# Patient Record
Sex: Female | Born: 1965
Health system: Southern US, Community
[De-identification: ages and names within clinical notes are randomized; demographics above are authoritative.]

## PROBLEM LIST (undated history)

## (undated) DIAGNOSIS — R519 Headache, unspecified: Secondary | ICD-10-CM

## (undated) DIAGNOSIS — J3089 Other allergic rhinitis: Secondary | ICD-10-CM

## (undated) DIAGNOSIS — Z86718 Personal history of other venous thrombosis and embolism: Secondary | ICD-10-CM

## (undated) DIAGNOSIS — R51 Headache: Secondary | ICD-10-CM

## (undated) DIAGNOSIS — T148XXA Other injury of unspecified body region, initial encounter: Secondary | ICD-10-CM

## (undated) DIAGNOSIS — G5603 Carpal tunnel syndrome, bilateral upper limbs: Principal | ICD-10-CM

## (undated) HISTORY — DX: Headache: R51

## (undated) HISTORY — DX: Other allergic rhinitis: J30.89

## (undated) HISTORY — DX: Headache, unspecified: R51.9

## (undated) HISTORY — DX: Carpal tunnel syndrome, bilateral upper limbs: G56.03

## (undated) HISTORY — DX: Other injury of unspecified body region, initial encounter: T14.8XXA

## (undated) HISTORY — PX: TOOTH EXTRACTION: SUR596

## (undated) HISTORY — DX: Personal history of other venous thrombosis and embolism: Z86.718

---

## 1999-08-21 ENCOUNTER — Other Ambulatory Visit: Admission: RE | Admit: 1999-08-21 | Discharge: 1999-08-21 | Payer: Self-pay

## 2000-11-29 ENCOUNTER — Other Ambulatory Visit: Admission: RE | Admit: 2000-11-29 | Discharge: 2000-11-29 | Payer: Self-pay | Admitting: Obstetrics and Gynecology

## 2002-04-21 ENCOUNTER — Other Ambulatory Visit: Admission: RE | Admit: 2002-04-21 | Discharge: 2002-04-21 | Payer: Self-pay | Admitting: Obstetrics and Gynecology

## 2003-04-30 ENCOUNTER — Other Ambulatory Visit: Admission: RE | Admit: 2003-04-30 | Discharge: 2003-04-30 | Payer: Self-pay | Admitting: Obstetrics and Gynecology

## 2004-05-05 ENCOUNTER — Other Ambulatory Visit: Admission: RE | Admit: 2004-05-05 | Discharge: 2004-05-05 | Payer: Self-pay | Admitting: *Deleted

## 2004-09-16 ENCOUNTER — Encounter: Admission: RE | Admit: 2004-09-16 | Discharge: 2004-09-16 | Payer: Self-pay | Admitting: *Deleted

## 2004-09-16 ENCOUNTER — Inpatient Hospital Stay (HOSPITAL_COMMUNITY): Admission: EM | Admit: 2004-09-16 | Discharge: 2004-09-17 | Payer: Self-pay | Admitting: *Deleted

## 2004-11-27 ENCOUNTER — Encounter: Admission: RE | Admit: 2004-11-27 | Discharge: 2004-11-27 | Payer: Self-pay | Admitting: Obstetrics and Gynecology

## 2005-05-13 ENCOUNTER — Other Ambulatory Visit: Admission: RE | Admit: 2005-05-13 | Discharge: 2005-05-13 | Payer: Self-pay | Admitting: *Deleted

## 2006-06-10 LAB — HM PAP SMEAR

## 2006-07-09 ENCOUNTER — Other Ambulatory Visit: Admission: RE | Admit: 2006-07-09 | Discharge: 2006-07-09 | Payer: Self-pay | Admitting: Obstetrics and Gynecology

## 2007-06-14 ENCOUNTER — Ambulatory Visit: Payer: Self-pay | Admitting: Internal Medicine

## 2007-06-14 DIAGNOSIS — N943 Premenstrual tension syndrome: Secondary | ICD-10-CM | POA: Insufficient documentation

## 2007-06-14 DIAGNOSIS — I82409 Acute embolism and thrombosis of unspecified deep veins of unspecified lower extremity: Secondary | ICD-10-CM | POA: Insufficient documentation

## 2007-06-14 DIAGNOSIS — Z86718 Personal history of other venous thrombosis and embolism: Secondary | ICD-10-CM | POA: Insufficient documentation

## 2007-06-17 ENCOUNTER — Ambulatory Visit: Payer: Self-pay | Admitting: Family Medicine

## 2007-06-17 ENCOUNTER — Encounter: Payer: Self-pay | Admitting: Internal Medicine

## 2007-07-13 ENCOUNTER — Ambulatory Visit: Payer: Self-pay | Admitting: Internal Medicine

## 2007-07-14 ENCOUNTER — Other Ambulatory Visit: Admission: RE | Admit: 2007-07-14 | Discharge: 2007-07-14 | Payer: Self-pay | Admitting: Obstetrics and Gynecology

## 2007-07-14 ENCOUNTER — Encounter: Payer: Self-pay | Admitting: Internal Medicine

## 2007-07-19 ENCOUNTER — Ambulatory Visit: Payer: Self-pay | Admitting: Internal Medicine

## 2007-07-19 DIAGNOSIS — E559 Vitamin D deficiency, unspecified: Secondary | ICD-10-CM | POA: Insufficient documentation

## 2007-07-19 DIAGNOSIS — M899 Disorder of bone, unspecified: Secondary | ICD-10-CM | POA: Insufficient documentation

## 2007-07-19 DIAGNOSIS — R82998 Other abnormal findings in urine: Secondary | ICD-10-CM | POA: Insufficient documentation

## 2007-07-19 DIAGNOSIS — M949 Disorder of cartilage, unspecified: Secondary | ICD-10-CM

## 2007-07-19 LAB — CONVERTED CEMR LAB
Blood in Urine, dipstick: NEGATIVE
Glucose, Urine, Semiquant: NEGATIVE
Ketones, urine, test strip: NEGATIVE
Urobilinogen, UA: 0.2
pH: 5.5

## 2007-08-05 ENCOUNTER — Encounter: Payer: Self-pay | Admitting: Internal Medicine

## 2007-10-13 ENCOUNTER — Ambulatory Visit: Payer: Self-pay | Admitting: Internal Medicine

## 2007-10-17 LAB — CONVERTED CEMR LAB: Vit D, 1,25-Dihydroxy: 42 (ref 30–89)

## 2008-09-28 ENCOUNTER — Other Ambulatory Visit: Admission: RE | Admit: 2008-09-28 | Discharge: 2008-09-28 | Payer: Self-pay | Admitting: Obstetrics & Gynecology

## 2008-10-22 ENCOUNTER — Encounter: Admission: RE | Admit: 2008-10-22 | Discharge: 2008-10-22 | Payer: Self-pay | Admitting: Obstetrics and Gynecology

## 2008-11-20 ENCOUNTER — Telehealth (INDEPENDENT_AMBULATORY_CARE_PROVIDER_SITE_OTHER): Payer: Self-pay | Admitting: *Deleted

## 2008-11-22 ENCOUNTER — Ambulatory Visit: Payer: Self-pay | Admitting: Internal Medicine

## 2008-11-22 LAB — CONVERTED CEMR LAB
Albumin: 4.1 g/dL (ref 3.5–5.2)
Basophils Relative: 5.2 % — ABNORMAL HIGH (ref 0.0–3.0)
Blood in Urine, dipstick: NEGATIVE
CO2: 27 meq/L (ref 19–32)
Chloride: 102 meq/L (ref 96–112)
Creatinine, Ser: 0.6 mg/dL (ref 0.4–1.2)
Eosinophils Absolute: 0.2 10*3/uL (ref 0.0–0.7)
Glucose, Bld: 78 mg/dL (ref 70–99)
Glucose, Urine, Semiquant: NEGATIVE
Hemoglobin: 13.8 g/dL (ref 12.0–15.0)
Ketones, urine, test strip: NEGATIVE
MCHC: 33.8 g/dL (ref 30.0–36.0)
MCV: 91 fL (ref 78.0–100.0)
Monocytes Absolute: 0.4 10*3/uL (ref 0.1–1.0)
Neutro Abs: 3.8 10*3/uL (ref 1.4–7.7)
Nitrite: NEGATIVE
RBC: 4.48 M/uL (ref 3.87–5.11)
RDW: 12.8 % (ref 11.5–14.6)
Sodium: 138 meq/L (ref 135–145)
Specific Gravity, Urine: 1.025
TSH: 1.95 microintl units/mL (ref 0.35–5.50)
Total CHOL/HDL Ratio: 4
Total Protein: 6.9 g/dL (ref 6.0–8.3)
Triglycerides: 113 mg/dL (ref 0.0–149.0)
Vit D, 25-Hydroxy: 33 ng/mL (ref 30–89)
pH: 5.5

## 2008-11-27 ENCOUNTER — Ambulatory Visit: Payer: Self-pay | Admitting: Internal Medicine

## 2008-11-27 DIAGNOSIS — J309 Allergic rhinitis, unspecified: Secondary | ICD-10-CM | POA: Insufficient documentation

## 2008-12-04 ENCOUNTER — Encounter: Payer: Self-pay | Admitting: Internal Medicine

## 2009-05-30 ENCOUNTER — Ambulatory Visit: Payer: Self-pay | Admitting: Internal Medicine

## 2009-06-04 LAB — CONVERTED CEMR LAB: Vit D, 25-Hydroxy: 44 ng/mL (ref 30–89)

## 2009-08-22 ENCOUNTER — Ambulatory Visit: Payer: Self-pay | Admitting: Internal Medicine

## 2009-08-22 ENCOUNTER — Encounter: Payer: Self-pay | Admitting: Internal Medicine

## 2009-09-12 ENCOUNTER — Telehealth: Payer: Self-pay | Admitting: Internal Medicine

## 2009-11-08 LAB — HM MAMMOGRAPHY: HM Mammogram: NORMAL

## 2009-11-14 ENCOUNTER — Encounter: Admission: RE | Admit: 2009-11-14 | Discharge: 2009-11-14 | Payer: Self-pay | Admitting: Obstetrics and Gynecology

## 2009-12-12 ENCOUNTER — Ambulatory Visit: Payer: Self-pay | Admitting: Internal Medicine

## 2009-12-12 LAB — CONVERTED CEMR LAB
ALT: 16 units/L (ref 0–35)
AST: 19 units/L (ref 0–37)
Albumin: 4.4 g/dL (ref 3.5–5.2)
Alkaline Phosphatase: 42 units/L (ref 39–117)
BUN: 13 mg/dL (ref 6–23)
Basophils Relative: 0.5 % (ref 0.0–3.0)
CO2: 28 meq/L (ref 19–32)
Chloride: 103 meq/L (ref 96–112)
Eosinophils Absolute: 0.2 10*3/uL (ref 0.0–0.7)
HDL: 51 mg/dL (ref >39.00)
Hemoglobin: 14.1 g/dL (ref 12.0–15.0)
Lymphocytes Relative: 22.8 % (ref 12.0–46.0)
MCHC: 34.6 g/dL (ref 30.0–36.0)
Monocytes Relative: 8.1 % (ref 3.0–12.0)
Neutro Abs: 4.1 10*3/uL (ref 1.4–7.7)
Nitrite: NEGATIVE
RBC: 4.47 M/uL (ref 3.87–5.11)
Sodium: 140 meq/L (ref 135–145)
Specific Gravity, Urine: 1.01
Total CHOL/HDL Ratio: 3
Total Protein: 7.3 g/dL (ref 6.0–8.3)
WBC Urine, dipstick: NEGATIVE

## 2009-12-20 ENCOUNTER — Ambulatory Visit: Payer: Self-pay | Admitting: Internal Medicine

## 2009-12-20 DIAGNOSIS — F418 Other specified anxiety disorders: Secondary | ICD-10-CM | POA: Insufficient documentation

## 2010-06-20 ENCOUNTER — Ambulatory Visit: Payer: Self-pay | Admitting: Internal Medicine

## 2010-06-26 ENCOUNTER — Ambulatory Visit: Payer: Self-pay | Admitting: Internal Medicine

## 2010-06-26 DIAGNOSIS — Z87891 Personal history of nicotine dependence: Secondary | ICD-10-CM | POA: Insufficient documentation

## 2010-07-08 ENCOUNTER — Telehealth: Payer: Self-pay | Admitting: *Deleted

## 2010-08-30 ENCOUNTER — Encounter: Payer: Self-pay | Admitting: Obstetrics and Gynecology

## 2010-09-07 LAB — CONVERTED CEMR LAB
ALT: 14 units/L (ref 0–35)
Alkaline Phosphatase: 45 units/L (ref 39–117)
BUN: 7 mg/dL (ref 6–23)
Basophils Relative: 0.3 % (ref 0.0–1.0)
CO2: 26 meq/L (ref 19–32)
Calcium: 9.4 mg/dL (ref 8.4–10.5)
Creatinine, Ser: 0.8 mg/dL (ref 0.4–1.2)
HDL: 50.6 mg/dL (ref >39.0)
Hemoglobin: 14.2 g/dL (ref 12.0–15.0)
Ketones, urine, test strip: NEGATIVE
LDL Cholesterol: 116 mg/dL — ABNORMAL HIGH (ref 0–99)
Monocytes Relative: 7.8 % (ref 3.0–11.0)
Nitrite: NEGATIVE
Platelets: 381 10*3/uL (ref 150–400)
RDW: 12.6 % (ref 11.5–14.6)
Total Bilirubin: 0.7 mg/dL (ref 0.3–1.2)
Total Protein: 6.7 g/dL (ref 6.0–8.3)
Triglycerides: 72 mg/dL (ref 0–149)
Urobilinogen, UA: 0.2
VLDL: 14 mg/dL (ref 0–40)
WBC Urine, dipstick: NEGATIVE

## 2010-09-09 NOTE — Assessment & Plan Note (Signed)
Summary: 6 month rov/njr   Vital Signs:  Patient profile:   45 year old female Menstrual status:  regular LMP:     06/12/2010 Weight:      168 pounds Pulse rate:   80 / minute BP sitting:   110 / 70  (left arm) Cuff size:   regular  Vitals Entered By: Romualdo Bolk, CMA Duncan Dull) (June 26, 2010 9:36 AM)  Contraindications/Deferment of Procedures/Staging:    Test/Procedure: FLU VAX    Reason for deferment: patient declined  CC: Follow-up visit on vit d level, Abdominal Pain LMP (date): 06/12/2010 LMP - Character: normal Menarche (age onset years): 14   Menses interval (days): 28 Menstrual flow (days): 5 Enter LMP: 06/12/2010 Last PAP Result normal   History of Present Illness: Kendra Fox comes in today  for follow up of vit d . and med check    Xanax  ocassional use  stil has one refill.   Skin  : differin.    helping acne  and expired rx.   has tried  other meds in the past per Dermatology   and not rosacea in past . NO sig help with oral antibiotic .    Topical helps  the   Vitamin  D   taking  every other or  q 3 weeks.   no   se .    Preventive Screening-Counseling & Management  Alcohol-Tobacco     Alcohol drinks/day: <1     Alcohol type: beer     Smoking Status: quit > 6 months     Packs/Day: 1/2     Year Quit: 2009  Caffeine-Diet-Exercise     Caffeine use/day: less than 1 a day     Does Patient Exercise: no  Current Medications (verified): 1)  Ibuprofen 800 Mg  Tabs (Ibuprofen) .Marland Kitchen.. 1 By Mouth Three Times A Day As Needed 2)  Alprazolam 0.25 Mg  Tabs (Alprazolam) .... 1/2 To 1 Once Daily As Needed 3)  Differin 0.1 %  Crea (Adapalene) .... Apply To Face At Bedtime 4)  Drisdol 16109 Unit  Caps (Ergocalciferol) .Marland Kitchen.. 1 By Mouth Every Other Week or As Directed 5)  Allegra 180 Mg Tabs (Fexofenadine Hcl) 6)  V[employer]amin Shoppe Women's Daily Multi Vitamin With Iron  Allergies (verified): No Known Drug Allergies  Past History:  Past medical,  surgical, family and social histories (including risk factors) reviewed for relevance to current acute and chronic problems.  Past Medical History: Reviewed history from 11/27/2008 and no changes required. DVT, hx of fracture l ankle x 2 Allergic rhinitis  Past Surgical History: Reviewed history from 07/19/2007 and no changes required. Denies surgical history  Past History:  Care Management: Gynecology: Ria Comment, NP Orthopedics: [place]  Dermatology: Karlyn Agee   Family History: Reviewed history from 12/20/2009 and no changes required. Family History of Respiratory disease-Father COPD-smoker Migraine- mother no osteoporosis thyroid kidney stones  Father / early parkinsons   Social History: Reviewed history from 12/20/2009 and no changes required. 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 Married 2 cats Current Smoker 20 years  stopped last year .    husband stopped  Alcohol use-yes Drug use-no Regular exercise-no vegan  see data base  soy milk   Physical Exam  General:  Well-developed,well-nourished,in no acute distress; alert,appropriate and cooperative throughout examination Head:  normocephalic and atraumatic.   Neck:  No deformities, masses, or tenderness noted. Lungs:  Normal respiratory effort, chest expands symmetrically. Lungs are clear to auscultation, no  crackles or wheezes. Heart:  Normal rate and regular rhythm. S1 and S2 normal without gallop, murmur, click, rub or other extra sounds. Pulses:  pulses intact without delay   Neurologic:  non focal  Skin:  face  with  mid erythema  no paules today  eyes clear  Cervical Nodes:  No lymphadenopathy noted Psych:  Oriented X3, good eye contact, not anxious appearing, and not depressed appearing.     Impression & Recommendations:  Problem # 1:  ACNE VULGARIS, ADULT (ICD-706.1) helpful   orals not helpful in the past Her updated medication list for this problem includes:    Differin 0.1  % Crea (Adapalene) .Marland Kitchen... Apply to face at bedtime  Problem # 2:  VITAMIN D DEFICIENCY (ICD-268.9) ok on every 2-3 weeks high dose . would like to decrease to otc  and repeat at her check up   original  was in the teens and now   oin 30 range .     Problem # 3:  ANXIETY, SITUATIONAL (ICD-308.3) stable using as needed meds   . call with refills   Problem # 4:  TOBACCO USE, QUIT (ICD-V15.82) continue  tobacco free   Problem # 5:  OSTEOPENIA (ICD-733.90)  Her updated medication list for this problem includes:    Drisdol 30160 Unit Caps (Ergocalciferol) .Marland Kitchen... 1 by mouth every other week or as directed  Complete Medication List: 1)  Ibuprofen 800 Mg Tabs (Ibuprofen) .Marland Kitchen.. 1 by mouth three times a day as needed 2)  Alprazolam 0.25 Mg Tabs (Alprazolam) .... 1/2 to 1 once daily as needed 3)  Differin 0.1 % Crea (Adapalene) .... Apply to face at bedtime 4)  Drisdol 10932 Unit Caps (Ergocalciferol) .Marland Kitchen.. 1 by mouth every other week or as directed 5)  Allegra 180 Mg Tabs (Fexofenadine hcl) 6)  [employer] Women's Daily Multi Vitamin With Iron    Patient Instructions: 1)  switch over to 1000- 2000 international units per day  of vitamin D 2)  call for refills . 3)  check up cpx with labs and vitamin d level .   in  May 2012 .  4)  .     Orders Added: 1)  Est. Patient Level III [35573]

## 2010-09-09 NOTE — Progress Notes (Signed)
Summary: REQ FOR VIT D CK  Phone Note Call from Patient   Caller: Patient [phone] Reason for Call: Talk to Doctor Summary of Call: Pt would like to have Vit D ck included in CPX labs she has done in May 2011?  Can you adv order for same (vit D ck)?  Initial call taken by: Debbra Riding,  September 12, 2009 9:31 AM  Follow-up for Phone Call        Phone Call Completed-----Order for vit d to be done at time of cpx labs viewed in EMR... same added to lab appt in May. Follow-up by: Debbra Riding,  September 12, 2009 9:44 AM

## 2010-09-09 NOTE — Progress Notes (Signed)
Summary: refill on xanax  Phone Note From Pharmacy   Caller: [facility] # [id number]* Reason for Call: Needs renewal Details for Reason: xanax Summary of Call: last filled on 05/10/10 #30 Initial call taken by: Romualdo Bolk, CMA (AAMA),  July 08, 2010 11:49 AM  Follow-up for Phone Call        ok x 3  Follow-up by: Madelin Headings MD,  July 08, 2010 12:43 PM  Additional Follow-up for Phone Call Additional follow up Details #1::        faxed back to pharmacy Additional Follow-up by: Romualdo Bolk, CMA Duncan Dull),  July 08, 2010 1:06 PM    Prescriptions: ALPRAZOLAM 0.25 MG  TABS (ALPRAZOLAM) 1/2 to 1 once daily as needed  #30 x 2   Entered by:   Romualdo Bolk, CMA (AAMA)   Authorized by:   Madelin Headings MD   Signed by:   Romualdo Bolk, CMA (AAMA) on 07/08/2010   Method used:   Handwritten   RxID:   [id number]

## 2010-09-09 NOTE — Assessment & Plan Note (Signed)
Summary: CPX // RS   Vital Signs:  Patient profile:   45 year old female Menstrual status:  regular LMP:     12/13/2009 Height:      64.75 inches Weight:      162 pounds BMI:     27.26 Pulse rate:   80 / minute BP sitting:   100 / 60  (left arm) Cuff size:   regular  Vitals Entered By: Romualdo Bolk, CMA (AAMA) (Dec 20, 2009 8:45 AM) CC: CPX no pap- Pt has a gyn who does paps LMP (date): 12/13/2009 LMP - Character: normal Menarche (age onset years): 14   Menses interval (days): 28 Menstrual flow (days): 5 Enter LMP: 12/13/2009 Last PAP Result normal   History of Present Illness: Kendra Fox  comes in comes in today   for preventive visit.  Since last visit  here  there have been no major changes in health status  .  GYne started medication  x anax  for situation anxiety   and  asked for [place] to prescribe.     Vit d Was put back on rx vit d when level was low.  No new fractures.      Preventive Care Screening  Mammogram:    Date:  11/08/2009    Results:  normal   Pap Smear:    Date:  10/08/2009    Results:  normal   Last Tetanus Booster:    Date:  08/10/2004    Results:  Historical    Preventive Screening-Counseling & Management  Alcohol-Tobacco     Alcohol drinks/day: <1     Alcohol type: beer     Smoking Status: quit > 6 months     Packs/Day: 1/2     Year Quit: 2009  Caffeine-Diet-Exercise     Caffeine use/day: less than 1 a day     Does Patient Exercise: no  Hep-HIV-STD-Contraception     Dental Visit-last 6 months yes     Sun Exposure-Excessive: no  Safety-Violence-Falls     Seat Belt Use: 100     Firearms in the Home: no firearms in the home     Smoke Detectors: yes      Blood Transfusions:  no.    Current Medications (verified): 1)  Ibuprofen 800 Mg  Tabs (Ibuprofen) .Marland Kitchen.. 1 By Mouth Three Times A Day As Needed 2)  Alprazolam 0.25 Mg  Tabs (Alprazolam) .... 1/2 To 1 Once Daily As Needed 3)  Differin 0.1 %  Crea (Adapalene) ....  Apply To Face At Bedtime 4)  Drisdol 32440 Unit  Caps (Ergocalciferol) .Marland Kitchen.. 1 By Mouth Q Week As Directed 5)  Allegra 180 Mg Tabs (Fexofenadine Hcl) 6)  Vitamin Shop[employer] Women's Daily Multi Vitamin With Iron  Allergies (verified): No Known Drug Allergies  Past History:  Past medical, surgical, family and social histories (including risk factors) reviewed, and no changes noted (except as noted below).  Past Medical History: Reviewed history from 11/27/2008 and no changes required. DVT, hx of fracture l ankle x 2 Allergic rhinitis  Past Surgical History: Reviewed history from 07/19/2007 and no changes required. Denies surgical history  Past History:  Care Management: Gynecology: Ria Comment, NP Orthopedics: [place]  Dermatology: Karlyn Agee   Family History: Reviewed history from 11/27/2008 and no changes required. Family History of Respiratory disease-Father COPD-smoker Migraine- mother no osteoporosis thyroid kidney stones  Father / early parkinsons   Social History: Reviewed history from 11/27/2008 and no changes required. 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    husband stopped  Alcohol use-yes Drug use-no Regular exercise-no vegan  see data base  soy milk  Caffeine use/day:  less than 1 a day Dental Care w/in 6 mos.:  yes Sun Exposure-Excessive:  no Blood Transfusions:  no  Review of Systems  The patient denies anorexia, fever, weight loss, weight gain, vision loss, decreased hearing, hoarseness, chest pain, syncope, dyspnea on exertion, peripheral edema, prolonged cough, headaches, hemoptysis, abdominal pain, melena, hematochezia, severe indigestion/heartburn, hematuria, incontinence, genital sores, muscle weakness, suspicious skin lesions, transient blindness, difficulty walking, depression, unusual weight change, abnormal bleeding, enlarged lymph nodes, angioedema, and breast  masses.   Physical Exam General Appearance: well developed, well nourished, no acute distress Eyes: conjunctiva and lids normal, PERRLA, EOMI, fundi WNL Ears, Nose, Mouth, Throat: TM clear, nares clear, oral exam WNL Neck: supple, no lymphadenopathy, no thyromegaly, no JVD Respiratory: clear to auscultation and percussion, respiratory effort normal Cardiovascular: regular rate and rhythm, S1-S2, no murmur, rub or gallop, no bruits, peripheral pulses normal and symmetric, no cyanosis, clubbing, edema or varicosities Chest: no scars, masses, tenderness; no asymmetry, skin changes, nipple discharge   Gastrointestinal: soft, non-tender; no hepatosplenomegaly, masses; active bowel sounds all quadrants,  Genitourinary: per gyne Lymphatic: no cervical, axillary or inguinal adenopathy Musculoskeletal: gait normal, muscle tone and strength WNL, no joint swelling, effusions, discoloration, crepitus  Skin: , good turgor, color WNL, no rashes, lesions, or ulcerations faded acne no active lesions today  Neurologic: normal mental status, normal reflexes, normal strength, sensation, and motion Psychiatric: alert; oriented to person, place and time Other Exam:  see labs  and DEXA     Impression & Recommendations:  Problem # 1:  PREVENTIVE HEALTH CARE (ICD-V70.0) Discussed nutrition,exercise,diet,healthy weight, vitamin D and calcium.   Problem # 2:  OSTEOPENIA (ICD-733.90) reviewed dexa      Her updated medication list for this problem includes:    Drisdol 30865 Unit Caps (Ergocalciferol) .Marland Kitchen... 1 by mouth every other week or as directed  Problem # 3:  ACNE VULGARIS, ADULT (ICD-706.1) Assessment: Unchanged disc rx  will continue  Her updated medication list for this problem includes:    Differin 0.1 % Crea (Adapalene) .Marland Kitchen... Apply to face at bedtime  Problem # 4:  VITAMIN D DEFICIENCY (ICD-268.9) better   but had to go back on  high dose vit d    will decrease and follow.   Problem # 5:   ANXIETY, SITUATIONAL (ICD-308.3) Assessment: New Discussed risk benefit   of medication and  use.    ok to  rx med and follow   Complete Medication List: 1)  Ibuprofen 800 Mg Tabs (Ibuprofen) .Marland Kitchen.. 1 by mouth three times a day as needed 2)  Alprazolam 0.25 Mg Tabs (Alprazolam) .... 1/2 to 1 once daily as needed 3)  Differin 0.1 % Crea (Adapalene) .... Apply to face at bedtime 4)  Drisdol 78469 Unit Caps (Ergocalciferol) .Marland Kitchen.. 1 by mouth every other week or as directed 5)  Allegra 180 Mg Tabs (Fexofenadine hcl) 6)  Vitamin Shoppe Women's Daily Multi Vitamin With Iron   Patient Instructions: 1)  call for refills   2)  continue weight bearing  exercise .  3)  Decrease the vit d to every other week   for now  4)  Vit d level in 6 months and then ROV . Prescriptions: ALPRAZOLAM 0.25 MG  TABS (ALPRAZOLAM) 1/2 to 1  once daily as needed  #30 x 2   Entered and Authorized by:   Madelin Headings MD   Signed by:   Madelin Headings MD on 12/20/2009   Method used:   Print then Give to Patient   RxID:   [id number]

## 2010-09-09 NOTE — Miscellaneous (Signed)
Summary: BONE DENSITY  Clinical Lists Changes  Orders: Added new Test order of T-Bone Densitometry (77080) - Signed Added new Test order of T-Lumbar Vertebral Assessment (77082) - Signed 

## 2010-11-04 ENCOUNTER — Other Ambulatory Visit: Payer: Self-pay | Admitting: Internal Medicine

## 2010-11-04 DIAGNOSIS — Z1231 Encounter for screening mammogram for malignant neoplasm of breast: Secondary | ICD-10-CM

## 2010-11-18 ENCOUNTER — Ambulatory Visit
Admission: RE | Admit: 2010-11-18 | Discharge: 2010-11-18 | Disposition: A | Discharge status: Home / Self Care | Payer: 59 | Source: Ambulatory Visit | Attending: Internal Medicine | Admitting: Internal Medicine

## 2010-11-18 DIAGNOSIS — Z1231 Encounter for screening mammogram for malignant neoplasm of breast: Secondary | ICD-10-CM

## 2010-12-19 ENCOUNTER — Other Ambulatory Visit (INDEPENDENT_AMBULATORY_CARE_PROVIDER_SITE_OTHER): Payer: 59 | Admitting: Internal Medicine

## 2010-12-19 DIAGNOSIS — Z0000 Encounter for general adult medical examination without abnormal findings: Secondary | ICD-10-CM

## 2010-12-19 LAB — HEPATIC FUNCTION PANEL
ALT: 17 U/L (ref 0–35)
Albumin: 4.3 g/dL (ref 3.5–5.2)
Alkaline Phosphatase: 39 U/L (ref 39–117)
Total Protein: 7 g/dL (ref 6.0–8.3)

## 2010-12-19 LAB — TSH: TSH: 2.53 u[IU]/mL (ref 0.35–5.50)

## 2010-12-19 LAB — POCT URINALYSIS DIPSTICK
Glucose, UA: NEGATIVE
Leukocytes, UA: NEGATIVE
pH, UA: 5.5

## 2010-12-19 LAB — CBC WITH DIFFERENTIAL/PLATELET
Basophils Relative: 0.7 % (ref 0.0–3.0)
Eosinophils Absolute: 0.2 10*3/uL (ref 0.0–0.7)
Hemoglobin: 14.3 g/dL (ref 12.0–15.0)
Lymphocytes Relative: 22.7 % (ref 12.0–46.0)
MCHC: 34 g/dL (ref 30.0–36.0)
MCV: 93.1 fl (ref 78.0–100.0)
Neutro Abs: 4.2 10*3/uL (ref 1.4–7.7)
RBC: 4.52 Mil/uL (ref 3.87–5.11)

## 2010-12-19 LAB — BASIC METABOLIC PANEL
CO2: 28 mEq/L (ref 19–32)
Calcium: 9.2 mg/dL (ref 8.4–10.5)
Chloride: 105 mEq/L (ref 96–112)
Sodium: 141 mEq/L (ref 135–145)

## 2010-12-19 LAB — LIPID PANEL: HDL: 49.1 mg/dL (ref >39.00)

## 2010-12-20 LAB — VITAMIN D 25 HYDROXY (VIT D DEFICIENCY, FRACTURES): Vit D, 25-Hydroxy: 33 ng/mL (ref 30–89)

## 2010-12-26 NOTE — Discharge Summary (Signed)
Kendra Fox, MARKERT             ACCOUNT NO.:  [id number]   MEDICAL RECORD NO.:  [id number]          PATIENT TYPE:  INP   LOCATION:  0375                         FACILITY:  [facility]   PHYSICIAN:  Lonia Blood, M.D.      DATE OF BIRTH:  [birth year]   DATE OF ADMISSION:  09/16/2004  DATE OF DISCHARGE:  09/17/2004                                 DISCHARGE SUMMARY   PRIMARY CARE PHYSICIAN:  Soyla Murphy. Renne Crigler, M.D.   DISCHARGE DIAGNOSES:  1.  Left lower extremity deep venous thrombosis.  2.  Left ankle fracture.  3.  Transient hypokalemia.  4.  Transient hyponatremia.   DISCHARGE MEDICATIONS:  1.  Coumadin start 10 mg today, then 5 mg daily, and further adjustment by      Dr. Carolee Rota office.  2.  Lovenox injection 75 mg q.12h. subcutaneously until INR is between 2 and      3.  3.  Darvocet-N 100 1-2 tablets q.4-6h. p.r.n. for pain.   DISPOSITION:  The patient is being discharged with home health care.  [facility] nurse will be over starting from tonight to help  the patient with her Lovenox injection as well as train the patient and her  husband on how to give the patient Lovenox injection until INR is 2-3.  The  patient will also have daily INR checks through the home health agency  starting from tomorrow and the results will be called in to Dr. Carolee Rota  office for further adjustment of her Coumadin dose.   PROCEDURES PERFORMED:  Left lower extremity Doppler ultrasound that shows  deep venous thrombosis of the left calf.  This was on September 16, 2004.   CONSULTATIONS:  None, although social visit done by orthopedics surgery.   BRIEF HISTORY AND PHYSICAL:  She is a 45 year old white female with no  significant past medical history, who broke her ankle about three weeks ago.  The patient has hard cast in place and has been doing well including onset  of new exercise on Thursday and Friday.  The patient started having cramps  in her left leg, which necessitated her  coming to the hospital.  She went  back to her orthopedic surgeon for further follow-up and was told to go over  to diagnostic radiology for further workup.  That workup showed that she had  left lower extremity DVT, hence, she was sent to the hospital.  The patient  had no chest pain or any symptoms of possible PE.  Her vitals were also very  stable in the emergency room.   LABORATORY DATA:  Her labs mainly showed transient hyponatremia with sodium  132 and potassium 3.4.  Otherwise, everything else was stable.   She was subsequently admitted for management of left lower extremity DVT  with no evidence of PE.   HOSPITAL COURSE:  1.  LEFT LOWER EXTREMITY DVT:  This seems to have been triggered by the left      ankle fracture and subsequent immobility.  The patient was initiated on      heparin and concomitant Coumadin therapy the next  day.  We discussed      with the patient at length.  She wants to go home sooner.  With that in      mind this arrangement was made where home health will come out to the      patient's house and assist her with subcutaneous Lovenox injection.  The      patient will be fully trained and once she is comfortable doing it, she      will continue to give herself shots or her husband will do that for her      until her INR is between 2 and 3.  Subsequent follow-up of her Coumadin      will be done by Dr. Merri Brunette, her primary care physician, in their      office.   1.  TRANSIENT HYPOKALEMIA:  This was subsequently repleted.  Potassium is      now 3.5.  Hyponatremia also corrected overnight with some IV fluids.   1.  LEFT ANKLE FRACTURE:  This is immobilized and the patient will have a      follow-up with her physician, who is Dr. Montez Morita, her orthopedic surgeon.      LG/MEDQ  D:  09/17/2004  T:  09/17/2004  Job:  [id number]   cc:   Soyla Murphy. Renne Crigler, M.D.  [address]  [place]  [place] [address]  Fax: [phone]

## 2010-12-26 NOTE — H&P (Signed)
Kendra Fox, ALLING NO.:  [id number]   MEDICAL RECORD NO.:  [id number]          PATIENT TYPE:  EMS   LOCATION:  ED                           FACILITY:  [facility]   PHYSICIAN:  Michaelyn Barter, M.D. DATE OF BIRTH:  [birth year]   DATE OF ADMISSION:  09/16/2004  DATE OF DISCHARGE:                                HISTORY & PHYSICAL   PRIMARY CARE PHYSICIAN:  Soyla Murphy. Renne Crigler, M.D.   CHIEF COMPLAINT:  Left leg DVT.   HISTORY OF PRESENT ILLNESS:  Kendra Fox is a 45 year old female with no  significant past medical history that states that she broke her left ankle  approximately three weeks ago. She stated that she started placing weight on  her left leg Wednesday or Thursday of last week. On Thursday/Friday of last  week she started developing a cramping pain in her left calf. The pain would  not go away, therefore she called her orthopedic surgeon, Dr. Valma Cava, and Dr. Thomasena Edis prescribed Robaxin which did not help.  She called  Dr. Thomasena Edis back and he [facility]. There, she was discovered to have a DVT in the left calf.  Subsequently, she called her primary care physician, Dr. Merri Brunette, and  he told her to come to the hospital for further evaluation. Currently she  denies having any chest pain, no shortness of breath. No nausea, no  vomiting, no fever or chills.   PAST MEDICAL HISTORY:  Fracture of the left ankle. The patient states that  she was told that it was a clean break and that she did not require any  surgery, only casting as necessary.   PAST SURGICAL HISTORY:  None.   ALLERGIES:  The patient cannot remember the medication that produced a rash  in the past.   HOME MEDICATIONS:  1.  Vicodin.  2.  Robaxin.   SOCIAL HISTORY:  Cigarettes--the patient smokes one pack to half a pack day.  Alcohol--the patient drinks six beers over the course of a week.   FAMILY HISTORY:  Mother--no  illnesses. Father--no illnesses.   REVIEW OF SYSTEMS:  As per HPI. Otherwise, all other systems are negative.   PHYSICAL EXAMINATION:  GENERAL: The patient does not appear to be in any  obvious distress.  VITAL SIGNS: Temperature 100.4, blood pressure 112/77, heart rate 86,  respirations 20, SPO2 97%.  HEENT: Anicteric. Extraocular movements are intact. Pupils equal, round, and  reactive to light.  NECK: Supple. No lymphadenopathy. Thyroid not palpable.  CARDIAC: S1 and S2 present. Regular rate and rhythm. No murmurs, gallops, or  rubs.  RESPIRATORY:  Lungs are clear bilaterally. No crackles or wheezes.  ABDOMEN: Soft, nontender, nondistended.  EXTREMITIES: The left leg distal extremity has an orthopedic boot present.  The right leg has no lower extremity edema.  NEUROLOGIC: The patient is alert and oriented times three.  MUSCULOSKELETAL: Strength is 5/5 upper and lower extremity strength.   LABORATORY DATA:  White blood cell count 7.7, hemoglobin 13.9, hematocrit  41.3, platelet count 401,000.  PT 12.4, INR 0.9, PTT  26. Sodium 132,  potassium 3.4, chloride 101, CO2 27, BUN 8, creatinine 0.6, glucose 91,  calcium 9.1.   ASSESSMENT/PLAN:  1.  Left calf deep venous thrombosis. Will admit the patient into the      hospital and start IV heparin. Will subsequently initial Coumadin. Will      provide Darvocet for pain on a p.r.n. basis.  2.  Hypokalemia. Will replete the potassium with K-Dur 3 mEq p.o. times one.  3.  Hyponatremia. Will monitor for now. September 15, 2004      OR/MEDQ  D:  09/16/2004  T:  09/16/2004  Job:  [id number]   cc:   Soyla Murphy. Renne Crigler, M.D.  [address]  [place]  [place] [address]  Fax: [phone]

## 2011-01-08 ENCOUNTER — Encounter: Payer: Self-pay | Admitting: Internal Medicine

## 2011-01-09 ENCOUNTER — Ambulatory Visit (INDEPENDENT_AMBULATORY_CARE_PROVIDER_SITE_OTHER): Payer: 59 | Admitting: Internal Medicine

## 2011-01-09 ENCOUNTER — Encounter: Payer: Self-pay | Admitting: Internal Medicine

## 2011-01-09 VITALS — BP 100/70 | HR 66 | Ht 65.0 in | Wt 168.0 lb

## 2011-01-09 DIAGNOSIS — Z86718 Personal history of other venous thrombosis and embolism: Secondary | ICD-10-CM

## 2011-01-09 DIAGNOSIS — J309 Allergic rhinitis, unspecified: Secondary | ICD-10-CM

## 2011-01-09 DIAGNOSIS — Z0000 Encounter for general adult medical examination without abnormal findings: Secondary | ICD-10-CM

## 2011-01-09 DIAGNOSIS — E559 Vitamin D deficiency, unspecified: Secondary | ICD-10-CM

## 2011-01-09 DIAGNOSIS — F438 Other reactions to severe stress: Secondary | ICD-10-CM

## 2011-01-09 NOTE — Patient Instructions (Addendum)
Continue healthy lifestyle exercise good sleep injury avoidance. Call when needs refill medication. Recheck in a year or. If you are seeing a gynecologist we can just make this as an office visit for medication check and have a wellness visit in 2 years.  Your EKG was normal today.

## 2011-01-09 NOTE — Progress Notes (Signed)
  Subjective:    Patient ID: Kendra Fox, female    DOB: Dec 31, 1965, 45 y.o.   MRN: [id number]  HPI Patient comes in for a wellness visit today. Since her last visit she has done generally well has changed jobs and is less stressed. No major changes in her health status injuries or other. Anxiety using  Xanax as needed.  Sleep  Ok Still tobacco free. No falls .  Has smoke detector and wears seat belts.  No firearms. No excess sun exposure. Sees dentist regularly . No depression Derm; no change. Past Medical History  Diagnosis Date  . History of DVT (deep vein thrombosis)   . Fracture     left ankle  . Allergic rhinitis    History reviewed. No pertinent past surgical history.  reports that she has quit smoking. She does not have any smokeless tobacco history on file. She reports that she drinks about 3.6 ounces of alcohol per week. She reports that she does not use illicit drugs. family history includes COPD in her father; Migraines in her mother; and Parkinsonism in her father. No Known Allergies   Review of Systems 12 system review recent sinus infection  .   differin for.     Left hand numbness at night  At times.. no weakness . Rest negative     Objective:   Physical Exam Physical Exam: Vital signs reviewed BJY:NWGN is a well-developed well-nourished alert cooperative  white female who appears her stated age in no acute distress.  HEENT: normocephalic  traumatic , Eyes: PERRL EOM's full, conjunctiva clear, Nares: paten,t no deformity discharge or tenderness.minimal congestion, Ears: no deformity EAC's clear TMs with normal landmarks. Mouth: clear OP, no lesions, edema.  Moist mucous membranes. Dentition in adequate repair. NECK: supple without masses, thyromegaly or bruits.slightly tender left ac node CHEST/PULM:  Clear to auscultation and percussion breath sounds equal no wheeze , rales or rhonchi. No chest wall deformities or tenderness. CV: PMI is nondisplaced, S1 S2 no  gallops, murmurs, rubs. Peripheral pulses are full without delay.No JVD .  ABDOMEN: Bowel sounds normal nontender  No guard or rebound, no hepato splenomegal no CVA tenderness.  No hernia. Extremtities:  No clubbing cyanosis or edema, no acute joint swelling or redness no focal atrophy NEURO:  Oriented x3, cranial nerves 3-12 appear to be intact, no obvious focal weakness,gait within normal limits no abnormal reflexes or asymmetrical SKIN: No acute rashes normal turgor, color, no bruising or petechiae. Multiple moles    PSYCH: Oriented, good eye contact, no obvious depression anxiety, cognition and judgment appear normal. Breast: normal by inspection . No dimpling, discharge, masses, tenderness or discharge . LN: no cervical axillary inguinal adenopathy see above Pelvic per gyne Labs reviewed  EKG NSR.     Assessment & Plan:  Preventive Health Care UTD   Counseled regarding healthy nutrition, exercise, sleep, injury prevention, calcium vit d and healthy weight . Vit d Deficiency  Better on otcs  Skin sees derm surveillance Anxiety better with jub change  Uses med prn can call for refill. Allergic  Rhinitis no change

## 2011-05-07 ENCOUNTER — Telehealth: Payer: Self-pay | Admitting: *Deleted

## 2011-05-07 MED ORDER — ALPRAZOLAM 0.25 MG PO TABS: 0.2500 mg | ORAL_TABLET | Freq: Every evening | ORAL | Status: DC | PRN

## 2011-05-07 NOTE — Telephone Encounter (Signed)
rx sent to pharmacy. Per Dr. Fabian Sharp - ok x 1.

## 2012-04-15 ENCOUNTER — Other Ambulatory Visit: Payer: 59

## 2012-04-19 ENCOUNTER — Other Ambulatory Visit (INDEPENDENT_AMBULATORY_CARE_PROVIDER_SITE_OTHER): Payer: BC Managed Care – PPO

## 2012-04-19 DIAGNOSIS — Z0000 Encounter for general adult medical examination without abnormal findings: Secondary | ICD-10-CM

## 2012-04-19 DIAGNOSIS — Z13220 Encounter for screening for lipoid disorders: Secondary | ICD-10-CM

## 2012-04-19 LAB — CBC WITH DIFFERENTIAL/PLATELET
Basophils Relative: 0.7 % (ref 0.0–3.0)
Eosinophils Relative: 2.3 % (ref 0.0–5.0)
Hemoglobin: 14.2 g/dL (ref 12.0–15.0)
Lymphocytes Relative: 18.8 % (ref 12.0–46.0)
MCHC: 33 g/dL (ref 30.0–36.0)
Monocytes Relative: 7.2 % (ref 3.0–12.0)
Neutro Abs: 4.9 10*3/uL (ref 1.4–7.7)
Neutrophils Relative %: 71 % (ref 43.0–77.0)
RBC: 4.64 Mil/uL (ref 3.87–5.11)
WBC: 6.9 10*3/uL (ref 4.5–10.5)

## 2012-04-19 LAB — LIPID PANEL
Cholesterol: 203 mg/dL — ABNORMAL HIGH (ref 0–200)
Total CHOL/HDL Ratio: 3
VLDL: 38.4 mg/dL (ref 0.0–40.0)

## 2012-04-19 LAB — HEPATIC FUNCTION PANEL
ALT: 21 U/L (ref 0–35)
AST: 22 U/L (ref 0–37)
Albumin: 4.3 g/dL (ref 3.5–5.2)
Alkaline Phosphatase: 43 U/L (ref 39–117)
Bilirubin, Direct: 0.1 mg/dL (ref 0.0–0.3)
Total Protein: 7.3 g/dL (ref 6.0–8.3)

## 2012-04-19 LAB — BASIC METABOLIC PANEL
CO2: 22 mEq/L (ref 19–32)
Calcium: 9.2 mg/dL (ref 8.4–10.5)
Chloride: 103 mEq/L (ref 96–112)
Creatinine, Ser: 0.6 mg/dL (ref 0.4–1.2)
Sodium: 137 mEq/L (ref 135–145)

## 2012-04-27 ENCOUNTER — Encounter: Payer: Self-pay | Admitting: Internal Medicine

## 2012-04-27 ENCOUNTER — Ambulatory Visit (INDEPENDENT_AMBULATORY_CARE_PROVIDER_SITE_OTHER): Payer: BC Managed Care – PPO | Admitting: Internal Medicine

## 2012-04-27 VITALS — BP 104/68 | HR 64 | Temp 98.6°F | Ht 64.25 in | Wt 172.0 lb

## 2012-04-27 DIAGNOSIS — Z0000 Encounter for general adult medical examination without abnormal findings: Secondary | ICD-10-CM

## 2012-04-27 DIAGNOSIS — N946 Dysmenorrhea, unspecified: Secondary | ICD-10-CM

## 2012-04-27 DIAGNOSIS — F438 Other reactions to severe stress: Secondary | ICD-10-CM

## 2012-04-27 DIAGNOSIS — Z86718 Personal history of other venous thrombosis and embolism: Secondary | ICD-10-CM

## 2012-04-27 DIAGNOSIS — J309 Allergic rhinitis, unspecified: Secondary | ICD-10-CM

## 2012-04-27 MED ORDER — IBUPROFEN 800 MG PO TABS: 800.0000 mg | ORAL_TABLET | Freq: Three times a day (TID) | ORAL | Status: DC | PRN

## 2012-04-27 MED ORDER — ALPRAZOLAM 0.25 MG PO TABS: 0.2500 mg | ORAL_TABLET | Freq: Every evening | ORAL | Status: DC | PRN

## 2012-04-27 NOTE — Progress Notes (Signed)
Subjective:    Patient ID: Kendra Fox, female    DOB: 1966-05-22, 46 y.o.   MRN: [id number]  HPI Patient comes in today for preventive visit and follow-up of medical issues. Update  history since  last visit: Anxiety : ocass   Use of xanax  Rare.   Has some expired.  Allergy uses allegra works better than calritin. Cramps reg menses  But last 6 days takes ibu 800 prn     Can we do rx?  utd on gyne exam . Review of Systems  ROS:  GEN/ HEENT: No fever, significant weight changes sweats headaches vision problems hearing changes, CV/ PULM; No chest pain shortness of breath cough, syncope,edema  change in exercise tolerance. GI /GU: No adominal pain, vomiting, change in bowel habits. No blood in the stool. No significant GU symptoms. SKIN/HEME: ,no acute skin rashes suspicious lesions or bleeding. No lymphadenopathy, nodules, masses.  Using otc retinoid instead of Differin for now.  NEURO/ PSYCH:  No neurologic signs such as weakness numbness. No depression anxiety. IMM/ Allergy: No unusual infections.  Allergy .   As per hpi  REST of 12 system review negative except as per HPI  Past history family history social history reviewed in the electronic medical record. Hx of dvt  In cast for second ankle fracture with immobilizations.  No hormones or fam hx.   rx with coumadin.     Objective:   Physical Exam BP 104/68  Pulse 64  Temp 98.6 F (37 C) (Oral)  Ht 5' 4.25" (1.632 m)  Wt 172 lb (78.019 kg)  BMI 29.29 kg/m2  LMP 03/29/2012  Physical Exam: Vital signs reviewed HQI:ONGE is a well-developed well-nourished alert cooperative  white female who appears her stated age in no acute distress.  HEENT: normocephalic atraumatic , Eyes: PERRL EOM's full, conjunctiva clear, Nares: paten,t no deformity discharge or tenderness., Ears: no deformity EAC's clear TMs with normal landmarks. Mouth: clear OP, no lesions, edema.  Moist mucous membranes. Dentition in adequate repair. NECK: supple  without masses, thyromegaly or bruits. CHEST/PULM:  Clear to auscultation and percussion breath sounds equal no wheeze , rales or rhonchi. No chest wall deformities or tenderness. CV: PMI is nondisplaced, S1 S2 no gallops, murmurs, rubs. Peripheral pulses are full without delay.No JVD .  Breast: normal by inspection . No dimpling, discharge, masses, tenderness or discharge . ABDOMEN: Bowel sounds normal nontender  No guard or rebound, no hepato splenomegal no CVA tenderness.  No hernia. Extremtities:  No clubbing cyanosis or edema, no acute joint swelling or redness no focal atrophy NEURO:  Oriented x3, cranial nerves 3-12 appear to be intact, no obvious focal weakness,gait within normal limits no abnormal reflexes or asymmetrical SKIN: No acute rashes normal turgor, color, no bruising or petechiae.  Some midl redness face  PSYCH: Oriented, good eye contact, no obvious depression anxiety, cognition and judgment appear normal. LN: no cervical axillary inguinal adenopathy Lab Results  Component Value Date   WBC 6.9 04/19/2012   HGB 14.2 04/19/2012   HCT 43.1 04/19/2012   PLT 410.0* 04/19/2012   GLUCOSE 87 04/19/2012   CHOL 203* 04/19/2012   TRIG 192.0* 04/19/2012   HDL 59.60 04/19/2012   LDLDIRECT 117.1 04/19/2012   LDLCALC 116* 12/19/2010   ALT 21 04/19/2012   AST 22 04/19/2012   NA 137 04/19/2012   K 4.1 04/19/2012   CL 103 04/19/2012   CREATININE 0.6 04/19/2012   BUN 12 04/19/2012   CO2 22 04/19/2012  TSH 2.49 04/19/2012       Assessment & Plan:  Preventive Health Care Counseled regarding healthy nutrition, exercise, sleep, injury prevention, calcium vit d and healthy weight .Declined flu shot today   Will be sending in work insurance form . ( will do at No extra charge)  slight elevation tg  Was not totally fasting. Allergic  On otc med Dysmenorrhea  On ibu  Ok to rx from our office .  Refill med  Hx of situational anxiety rare to little use ok to refill 30 for now .   Risk benefit of  medication discussed. Remote hx of dvt related to casting for second ankle fracture left  and immobilization. At this time no need to do  Coag screen . She is not on hormonal therapy .

## 2012-04-27 NOTE — Patient Instructions (Signed)
Continue lifestyle intervention healthy eating and exercise . Your exam is normal at this time.  Call if need refill of med.

## 2012-10-11 ENCOUNTER — Telehealth: Payer: Self-pay | Admitting: Family Medicine

## 2012-10-11 NOTE — Telephone Encounter (Signed)
Pt is requesting refills through [facility] on [address] [facility].  Last seen for CPE on 04/27/12 and filled on that day #30 with 0 additional refills.  Future appt scheduled for 05/02/13.  Please advise.  Thanks!!

## 2012-10-11 NOTE — Telephone Encounter (Signed)
Ok to refill x 1  

## 2012-10-12 ENCOUNTER — Other Ambulatory Visit: Payer: Self-pay | Admitting: Family Medicine

## 2012-10-12 MED ORDER — ALPRAZOLAM 0.25 MG PO TABS: 0.2500 mg | ORAL_TABLET | Freq: Every evening | ORAL | Status: DC | PRN

## 2012-10-12 NOTE — Telephone Encounter (Signed)
Called to the pharmacy and left on voicemail. 

## 2012-10-28 ENCOUNTER — Other Ambulatory Visit: Payer: Self-pay

## 2012-10-28 DIAGNOSIS — Z1231 Encounter for screening mammogram for malignant neoplasm of breast: Secondary | ICD-10-CM

## 2012-11-08 LAB — HM MAMMOGRAPHY: HM Mammogram: NORMAL

## 2012-11-08 LAB — HM PAP SMEAR: HM Pap smear: NORMAL

## 2012-12-06 ENCOUNTER — Ambulatory Visit
Admission: RE | Admit: 2012-12-06 | Discharge: 2012-12-06 | Disposition: A | Discharge status: Home / Self Care | Payer: BC Managed Care – PPO | Source: Ambulatory Visit

## 2012-12-06 DIAGNOSIS — Z1231 Encounter for screening mammogram for malignant neoplasm of breast: Secondary | ICD-10-CM

## 2012-12-12 ENCOUNTER — Encounter: Payer: Self-pay | Admitting: Internal Medicine

## 2012-12-12 ENCOUNTER — Ambulatory Visit (INDEPENDENT_AMBULATORY_CARE_PROVIDER_SITE_OTHER): Payer: BC Managed Care – PPO | Admitting: Internal Medicine

## 2012-12-12 VITALS — BP 94/66 | HR 64 | Temp 98.2°F | Wt 166.0 lb

## 2012-12-12 DIAGNOSIS — J302 Other seasonal allergic rhinitis: Secondary | ICD-10-CM

## 2012-12-12 DIAGNOSIS — J309 Allergic rhinitis, unspecified: Secondary | ICD-10-CM

## 2012-12-12 NOTE — Progress Notes (Signed)
Chief Complaint  Patient presents with  . Sore Throat    Has treated with Allegra.  Has also tried Flonase.  . Cough  . sneezing  . Nasal Congestion    HPI: Patient comes in today for SDA for  new problem evaluation. Ran 5 k   2 days ago  High pollen and took allegra and had throat pain and then cough and sneezing and  Some production. White to grey.   NO fever  Had some flonase  left over from a sinus allergy infection last April where she was seen in urgent care And started taking this. Some help.   Eyes itching. No active sneezing no chest pain shortness of breath or wheezing. He doesn't feel that well up until today she's felt a little better no associated high fever. Tends to get spring allergies ROS: See pertinent positives and negatives per HPI.  Past Medical History  Diagnosis Date  . History of DVT (deep vein thrombosis)     2006   sp ankle fracture  immobilization   . Fracture     left ankle  . Allergic rhinitis     Family History  Problem Relation Age of Onset  . Migraines Mother   . COPD Father     smoker  . Parkinsonism Father     History   Social History  . Marital Status: Married    Spouse Name: N/A    Number of Children: N/A  . Years of Education: N/A   Social History Main Topics  . Smoking status: Former Games developer  . Smokeless tobacco: None  . Alcohol Use: 3.6 oz/week    6 Glasses of wine per week  . Drug Use: No  . Sexually Active:    Other Topics Concern  . 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     Married  HHof 2    1 cats   Regular exercise-no   Vegan soy milk     ocass etoh 2-3 etoh neg tobacco 1-2 tea     Outpatient Encounter Prescriptions as of 12/12/2012  Medication Sig Dispense Refill  . ALPRAZolam (XANAX) 0.25 MG tablet Take 1 tablet (0.25 mg total) by mouth at bedtime as needed for anxiety.  30 tablet  0  . fexofenadine (ALLEGRA) 180 MG tablet  Take 180 mg by mouth daily.        . fluticasone (FLONASE) 50 MCG/ACT nasal spray Place 2 sprays into the nose daily.      Marland Kitchen ibuprofen (ADVIL,MOTRIN) 800 MG tablet Take 1 tablet (800 mg total) by mouth every 8 (eight) hours as needed.  30 tablet  2  . MULTIPLE VITAMIN PO Take by mouth.        . [DISCONTINUED] adapalene (DIFFERIN) 0.1 % cream Apply topically at bedtime.         No facility-administered encounter medications on file as of 12/12/2012.    EXAM:  BP 94/66  Pulse 64  Temp(Src) 98.2 F (36.8 C) (Oral)  Wt 166 lb (75.297 kg)  BMI 28.27 kg/m2  SpO2 97%  LMP 12/08/2012  Body mass index is 28.27 kg/(m^2).  GENERAL: vitals reviewed and listed above, alert, oriented, appears well hydrated and in no acute distress  HEENT: Normocephalic ;atraumatic , Eyes;  PERRL, EOMs  Full, lids and conjunctiva clear,,Ears: no deformities, canals nl, TM landmarks normal, Nose: no deformity or discharge congested face minimally tender left maxillary Mouth :  OP clear without lesion or edema . NECK: no obvious masses on inspection palpation no significant adenopathy  LUNGS: clear to auscultation bilaterally, no wheezes, rales or rhonchi, good air movement  CV: HRRR, no clubbing cyanosis or  peripheral edema nl cap refill   MS: moves all extremities without noticeable focal  abnormality  PSYCH: pleasant and cooperative, no obvious depression or anxiety  ASSESSMENT AND PLAN:  Discussed the following assessment and plan:  Allergic sinusitis  Allergic rhinitis, seasonal This presents more like an allergic sinusitis expectant management saline nasal steroids antihistamines and contact us with alarm features or if persistent progressive pain or findings consistent with a bacterial sinusitis. She can contact us for refills needed for the Flonase. -Patient advised to return or notify health care team  if symptoms worsen or persist or new concerns arise.  Patient Instructions  Agree that a lot of  your sx are  From allergy    Stay on  flonase 2 sprays each nostril  once a day.    The longer you are on it the better  At least  5- 7 days.   Allegra 180 max per day /zyrtec  10 mg per day.    Contact us if   persistent or progressive face pain fever  No improvement in sinus pressure drainage  After 10 days or so  Or ir worse. Cough could get wporse before gets better .  Antibiotics not helpful at this time   .  Contact us if above   Persistence .      Neta Mends. Panosh M.D.

## 2012-12-12 NOTE — Patient Instructions (Addendum)
Agree that a lot of your sx are  From allergy    Stay on  flonase 2 sprays each nostril  once a day.    The longer you are on it the better  At least  5- 7 days.   Allegra 180 max per day /zyrtec  10 mg per day.    Contact us if   persistent or progressive face pain fever  No improvement in sinus pressure drainage  After 10 days or so  Or ir worse. Cough could get wporse before gets better .  Antibiotics not helpful at this time   .  Contact us if above   Persistence .

## 2012-12-30 ENCOUNTER — Other Ambulatory Visit: Payer: Self-pay | Admitting: Internal Medicine

## 2012-12-30 ENCOUNTER — Telehealth: Payer: Self-pay | Admitting: Family Medicine

## 2012-12-30 ENCOUNTER — Telehealth: Payer: Self-pay | Admitting: Internal Medicine

## 2012-12-30 MED ORDER — ALPRAZOLAM 0.25 MG PO TABS: 0.2500 mg | ORAL_TABLET | Freq: Every evening | ORAL | Status: DC | PRN

## 2012-12-30 MED ORDER — FLUTICASONE PROPIONATE 50 MCG/ACT NA SUSP: 2.0000 | Freq: Every day | NASAL | Status: DC

## 2012-12-30 NOTE — Telephone Encounter (Signed)
Pt called to request an RX of Flonoase, she would like it to be sent to [facility] on [address]. Please assist.

## 2012-12-30 NOTE — Telephone Encounter (Signed)
Ok x 1

## 2012-12-30 NOTE — Telephone Encounter (Signed)
Last filled on 10/5312 #30 with 0 additional refills Seen acutely on 12/12/12 Has a CPE scheduled on 05/02/13. Please advise.  Thanks!!

## 2012-12-30 NOTE — Telephone Encounter (Signed)
Called and left message on pharmacy voicemail refilling the rx.

## 2012-12-30 NOTE — Telephone Encounter (Signed)
Sent to the [facility] on [address] by [facility].

## 2013-03-20 ENCOUNTER — Telehealth: Payer: Self-pay | Admitting: Internal Medicine

## 2013-03-20 ENCOUNTER — Other Ambulatory Visit: Payer: Self-pay | Admitting: Family Medicine

## 2013-03-20 MED ORDER — ALPRAZOLAM 0.25 MG PO TABS: 0.2500 mg | ORAL_TABLET | Freq: Every evening | ORAL | Status: DC | PRN

## 2013-03-20 NOTE — Telephone Encounter (Signed)
Last seen on 12/12/12 for allergic sinusitis Had last CPE on 04/27/12 Has future CPE scheduled for 05/10/13 Last filled on 12/30/12 #30 with 0 additional refills. May I fill #30?

## 2013-03-20 NOTE — Telephone Encounter (Signed)
Ok to give one refill #30 in PCP absence. Follow up with PCP for all further refills.

## 2013-03-20 NOTE — Telephone Encounter (Signed)
PT is calling to request a 3 month supply of ALPRAZolam (XANAX) 0.25 MG tablet. She would like it [facility] to [facility] on [address]. She states that [facility] has requested [facility] times, and she is completely out. Please assist.

## 2013-03-20 NOTE — Telephone Encounter (Signed)
Patient notified medication called in to the pharmacy.

## 2013-04-25 ENCOUNTER — Ambulatory Visit (INDEPENDENT_AMBULATORY_CARE_PROVIDER_SITE_OTHER): Payer: BC Managed Care – PPO | Admitting: Family Medicine

## 2013-04-25 ENCOUNTER — Encounter: Payer: Self-pay | Admitting: Family Medicine

## 2013-04-25 ENCOUNTER — Other Ambulatory Visit (INDEPENDENT_AMBULATORY_CARE_PROVIDER_SITE_OTHER): Payer: BC Managed Care – PPO

## 2013-04-25 VITALS — BP 110/80 | Temp 98.6°F | Wt 156.0 lb

## 2013-04-25 DIAGNOSIS — Z0000 Encounter for general adult medical examination without abnormal findings: Secondary | ICD-10-CM

## 2013-04-25 DIAGNOSIS — J329 Chronic sinusitis, unspecified: Secondary | ICD-10-CM

## 2013-04-25 LAB — CBC WITH DIFFERENTIAL/PLATELET
Basophils Relative: 0.5 % (ref 0.0–3.0)
Eosinophils Absolute: 0.4 10*3/uL (ref 0.0–0.7)
Eosinophils Relative: 4.9 % (ref 0.0–5.0)
Hemoglobin: 14.2 g/dL (ref 12.0–15.0)
Lymphocytes Relative: 19 % (ref 12.0–46.0)
MCHC: 33.9 g/dL (ref 30.0–36.0)
Monocytes Relative: 6.6 % (ref 3.0–12.0)
Neutro Abs: 5.9 10*3/uL (ref 1.4–7.7)
Neutrophils Relative %: 69 % (ref 43.0–77.0)
RBC: 4.65 Mil/uL (ref 3.87–5.11)
WBC: 8.5 10*3/uL (ref 4.5–10.5)

## 2013-04-25 LAB — HEPATIC FUNCTION PANEL
ALT: 18 U/L (ref 0–35)
AST: 19 U/L (ref 0–37)
Albumin: 4.4 g/dL (ref 3.5–5.2)
Alkaline Phosphatase: 36 U/L — ABNORMAL LOW (ref 39–117)
Bilirubin, Direct: 0 mg/dL (ref 0.0–0.3)
Total Protein: 7.5 g/dL (ref 6.0–8.3)

## 2013-04-25 LAB — LIPID PANEL: Total CHOL/HDL Ratio: 3

## 2013-04-25 LAB — LDL CHOLESTEROL, DIRECT: Direct LDL: 121 mg/dL

## 2013-04-25 LAB — BASIC METABOLIC PANEL
CO2: 25 mEq/L (ref 19–32)
Calcium: 9.2 mg/dL (ref 8.4–10.5)
Sodium: 136 mEq/L (ref 135–145)

## 2013-04-25 MED ORDER — AMOXICILLIN 875 MG PO TABS: 875.0000 mg | ORAL_TABLET | Freq: Two times a day (BID) | ORAL | Status: DC

## 2013-04-25 NOTE — Progress Notes (Signed)
Chief Complaint  Patient presents with  . Sinusitis    pain and pressure and upper teeth    HPI:  Acute visit for sinus infection: -started: 2 weeks ago -symptoms: nasal congestion, sinus pain and pressure and upper teeth pain on R, cough, thick congestion, drainage -denies: fevers, SOB, NVD -has tried: tylenol, sudafed -hx of sinusitis in the past  ROS: See pertinent positives and negatives per HPI.  Past Medical History  Diagnosis Date  . History of DVT (deep vein thrombosis)     2006   sp ankle fracture  immobilization   . Fracture     left ankle  . Allergic rhinitis     No past surgical history on file.  Family History  Problem Relation Age of Onset  . Migraines Mother   . COPD Father     smoker  . Parkinsonism Father     History   Social History  . Marital Status: Married    Spouse Name: N/A    Number of Children: N/A  . Years of Education: N/A   Social History Main Topics  . Smoking status: Former Games developer  . Smokeless tobacco: None  . Alcohol Use: 3.6 oz/week    6 Glasses of wine per week  . Drug Use: No  . Sexual Activity:    Other Topics Concern  . 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     Married  HHof 2    1 cats   Regular exercise-no   Vegan soy milk     ocass etoh 2-3 etoh neg tobacco 1-2 tea     Current outpatient prescriptions:ALPRAZolam (XANAX) 0.25 MG tablet, Take 1 tablet (0.25 mg total) by mouth at bedtime as needed for anxiety., Disp: 30 tablet, Rfl: 0;  fexofenadine (ALLEGRA) 180 MG tablet, Take 180 mg by mouth daily.  , Disp: , Rfl: ;  fluticasone (FLONASE) 50 MCG/ACT nasal spray, Place 2 sprays into the nose daily., Disp: 16 g, Rfl: 5 ibuprofen (ADVIL,MOTRIN) 800 MG tablet, Take 1 tablet (800 mg total) by mouth every 8 (eight) hours as needed., Disp: 30 tablet, Rfl: 2;  MULTIPLE VITAMIN PO, Take by mouth.  , Disp: , Rfl: ;  amoxicillin (AMOXIL)  875 MG tablet, Take 1 tablet (875 mg total) by mouth 2 (two) times daily., Disp: 20 tablet, Rfl: 0  EXAM:  Filed Vitals:   04/25/13 0810  BP: 110/80  Temp: 98.6 F (37 C)    Body mass index is 26.57 kg/(m^2).  GENERAL: vitals reviewed and listed above, alert, oriented, appears well hydrated and in no acute distress  HEENT: atraumatic, conjunttiva clear, no obvious abnormalities on inspection of external nose and ears, normal appearance of ear canals and TMs, clear nasal congestion, mild post oropharyngeal erythema with PND, no tonsillar edema or exudate, R max sinus TTP  NECK: no obvious masses on inspection  LUNGS: clear to auscultation bilaterally, no wheezes, rales or rhonchi, good air movement  CV: HRRR, no peripheral edema  MS: moves all extremities without noticeable abnormality  PSYCH: pleasant and cooperative, no obvious depression or anxiety  ASSESSMENT AND PLAN:  Discussed the following assessment and plan:  Sinusitis - Plan: amoxicillin (AMOXIL) 875 MG tablet  -Patient advised to return or notify a doctor immediately if symptoms worsen or persist or new concerns arise.  Patient Instructions  INSTRUCTIONS FOR UPPER RESPIRATORY INFECTION:  -plenty of rest and fluids  -As we  discussed, we have prescribed a new medication (AMOXICILLIN) for you at this appointment. We discussed the common and serious potential adverse effects of this medication and you can review these and more with the pharmacist when you pick up your medication.  Please follow the instructions for use carefully and notify us immediately if you have any problems taking this medication.  -nasal saline wash 2-3 times daily (use prepackaged nasal saline or bottled/distilled water if making your own)   -can use sinex or afrin nasal spray for drainage and nasal congestion - but do NOT use longer then 3-4 days  -can use tylenol or ibuprofen as directed for aches and sorethroat  -in the winter time,  using a humidifier at night is helpful (please follow cleaning instructions)  -if you are taking a cough medication - use only as directed, may also try a teaspoon of honey to coat the throat and throat lozenges  -for sore throat, salt water gargles can help  -follow up if you have fevers, facial pain, tooth pain, difficulty breathing or are worsening or not getting better in 5-7 days      Kendra Fox R.

## 2013-04-25 NOTE — Patient Instructions (Signed)
INSTRUCTIONS FOR UPPER RESPIRATORY INFECTION:  -plenty of rest and fluids  -As we discussed, we have prescribed a new medication (AMOXICILLIN)for you at this appointment. We discussed the common and serious potential adverse effects of this medication and you can review these and more with the pharmacist when you pick up your medication.  Please follow the instructions for use carefully and notify us immediately if you have any problems taking this medication.  -nasal saline wash 2-3 times daily (use prepackaged nasal saline or bottled/distilled water if making your own)   -can use sinex or afrin nasal spray for drainage and nasal congestion - but do NOT use longer then 3-4 days  -can use tylenol or ibuprofen as directed for aches and sorethroat  -in the winter time, using a humidifier at night is helpful (please follow cleaning instructions)  -if you are taking a cough medication - use only as directed, may also try a teaspoon of honey to coat the throat and throat lozenges  -for sore throat, salt water gargles can help  -follow up if you have fevers, facial pain, tooth pain, difficulty breathing or are worsening or not getting better in 5-7 days  

## 2013-05-02 ENCOUNTER — Encounter: Payer: BC Managed Care – PPO | Admitting: Internal Medicine

## 2013-05-05 ENCOUNTER — Telehealth: Payer: Self-pay | Admitting: Internal Medicine

## 2013-05-05 NOTE — Telephone Encounter (Signed)
Patient Information:  Caller Name: [place]  Phone: [phone]  Patient: Kendra Fox, Kendra Fox  Gender: Female  DOB: 06-Oct-1965  Age: 47 Years  PCP: Berniece Andreas (Family Practice)  Pregnant: No  Office Follow Up:  Does the office need to follow up with this patient?: No  Instructions For The Office: N/A  RN Note:  Ongoing sinus pain and congestion with moderate, intermittent headaches; headache rated 6/10. Declined to schedule appointment due to unable to miss more work.  Reported MD said to call back if symptoms continued.  Expained must be seen for antibiotics per MD order.  [facility].  Advised [facility] office has Saturday hours.  Hydrate and humidify, use nasal saline, warm compresses to face, and Guaifenesin to loosen mucus.  Pharmacy: [facility] on [address].  Symptoms  Reason For Call & Symptoms: Complteted 10 day Amoxicillin 05/04/13 but still has sinus pressure in cheeks and sinus congestion.  Asking if needs more antibiotic.  Reviewed Health History In EMR: Yes  Reviewed Medications In EMR: Yes  Reviewed Allergies In EMR: Yes  Reviewed Surgeries / Procedures: Yes  Date of Onset of Symptoms: 04/11/2013  Treatments Tried: Amoxicillin, Mucinex  Treatments Tried Worked: No OB / GYN:  LMP: 05/02/2013  Guideline(s) Used:  Sinus Pain and Congestion  Disposition Per Guideline:   See Today or Tomorrow in Office  Reason For Disposition Reached:   Sinus congestion (pressure, fullness) present > 10 days  Advice Given:  Reassurance:   Sinus congestion is a normal part of a cold.  Antibiotics are not helpful for the sinus congestion that occurs with colds.  For a Stuffy Nose - Use Nasal Washes:  Introduction: Saline (salt water) nasal irrigation (nasal wash) is an effective and simple home remedy for treating stuffy nose and sinus congestion. The nose can be irrigated by pouring, spraying, or squirting salt water into the nose and then letting it run back out.  Methods: There are several ways to perform nasal irrigation. You can use a saline nasal spray bottle (available over-the-counter), a rubber ear syringe, a medical syringe without the needle, or a Neti Pot.  Hydration:  Drink plenty of liquids (6-8 glasses of water daily). If the air in your home is dry, use a cool mist humidifier  Call Back If:   Sinus congestion (fullness) lasts longer than 10 days  You become worse.  Patient Refused Recommendation:  Patient Refused Appt, Patient Requests Appt At Later Date  Will keep apointment with Dr Fabian Sharp for 05/10/13 at 1500 or be seen at [facility].

## 2013-05-10 ENCOUNTER — Ambulatory Visit (INDEPENDENT_AMBULATORY_CARE_PROVIDER_SITE_OTHER): Payer: BC Managed Care – PPO | Admitting: Internal Medicine

## 2013-05-10 ENCOUNTER — Encounter: Payer: Self-pay | Admitting: Internal Medicine

## 2013-05-10 VITALS — BP 104/68 | HR 72 | Temp 98.4°F | Ht 64.5 in | Wt 160.0 lb

## 2013-05-10 DIAGNOSIS — J0100 Acute maxillary sinusitis, unspecified: Secondary | ICD-10-CM

## 2013-05-10 DIAGNOSIS — Z0000 Encounter for general adult medical examination without abnormal findings: Secondary | ICD-10-CM

## 2013-05-10 DIAGNOSIS — E785 Hyperlipidemia, unspecified: Secondary | ICD-10-CM

## 2013-05-10 MED ORDER — AMOXICILLIN-POT CLAVULANATE 875-125 MG PO TABS: 1.0000 | ORAL_TABLET | Freq: Two times a day (BID) | ORAL | Status: DC

## 2013-05-10 NOTE — Progress Notes (Signed)
Chief Complaint  Patient presents with  . Annual Exam    sinus infection not gone has form    HPI: Patient comes in today for Preventive Health Care visit   rx for sinusitis and  About 50 % better still has yellow drainage and some soreness on right maxilla area no fever cough not using flonase at this point no se of the amox   Had mole removal  Atypical cells dr Karlyn Agee   utd on pap   ocass travel [place] of xanax sleep etc.    ROS:  GEN/ HEENT: No fever, significant weight changes sweats headaches vision problems hearing changes, CV/ PULM; No chest pain shortness of breath cough, syncope,edema  change in exercise tolerance. GI /GU: No adominal pain, vomiting, change in bowel habits. No blood in the stool. No significant GU symptoms. SKIN/HEME: ,no acute skin rashes suspicious lesions or bleeding. No lymphadenopathy, nodules, masses.  NEURO/ PSYCH:  No neurologic signs such as weakness numbness. No depression anxiety. IMM/ Allergy: No unusual infections.  Allergy .   REST of 12 system review negative except as per HPI   Past Medical History  Diagnosis Date  . History of DVT (deep vein thrombosis)     2006   sp ankle fracture  immobilization   . Fracture     left ankle  . Allergic rhinitis     Family History  Problem Relation Age of Onset  . Migraines Mother   . COPD Father     smoker  . Parkinsonism Father   History reviewed. No pertinent past surgical history.   History   Social History  . Marital Status: Married    Spouse Name: N/A    Number of Children: N/A  . Years of Education: N/A   Social History Main Topics  . Smoking status: Former Games developer  . Smokeless tobacco: None  . Alcohol Use: 3.6 oz/week    6 Glasses of wine per week  . Drug Use: No  . Sexual Activity:    Other Topics Concern  . 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     Works 40 hours per week .   Married  HHof 2       Regular exercise-no   Vegan soy milk     ocass etoh 2-3 etoh neg tobacco 1-2 tea    8 hours sleep     Outpatient Encounter Prescriptions as of 05/10/2013  Medication Sig Dispense Refill  . ALPRAZolam (XANAX) 0.25 MG tablet Take 1 tablet (0.25 mg total) by mouth at bedtime as needed for anxiety.  30 tablet  0  . fexofenadine (ALLEGRA) 180 MG tablet Take 180 mg by mouth daily.        . fluticasone (FLONASE) 50 MCG/ACT nasal spray Place 2 sprays into the nose daily.  16 g  5  . ibuprofen (ADVIL,MOTRIN) 800 MG tablet Take 1 tablet (800 mg total) by mouth every 8 (eight) hours as needed.  30 tablet  2  . MULTIPLE VITAMIN PO Take by mouth.        Marland Kitchen amoxicillin-clavulanate (AUGMENTIN) 875-125 MG per tablet Take 1 tablet by mouth every 12 (twelve) hours. For sinusitis  20 tablet  0  . [DISCONTINUED] amoxicillin (AMOXIL) 875 MG tablet Take 1 tablet (875 mg total) by mouth 2 (two) times daily.  20 tablet  0   No facility-administered encounter medications on file as of 05/10/2013.  EXAM:  BP 104/68  Pulse 72  Temp(Src) 98.4 F (36.9 C) (Oral)  Ht 5' 4.5" (1.638 m)  Wt 160 lb (72.576 kg)  BMI 27.05 kg/m2  SpO2 97%  LMP 05/04/2013  Body mass index is 27.05 kg/(m^2).  Physical Exam: Vital signs reviewed WUJ:WJXB is a well-developed well-nourished alert cooperative   female who appears her stated age in no acute distress.  HEENT: normocephalic atraumatic , Eyes: PERRL EOM's full, conjunctiva clear, Nares: paten,t no deformity congested left maxillary  tenderness  Frontal ok Ears: no deformity EAC's clear TMs with normal landmarks. Mouth: clear OP, no lesions, edema.  Moist mucous membranes. Dentition in adequate repair. NECK: supple without masses, thyromegaly or bruits. CHEST/PULM:  Clear to auscultation and percussion breath sounds equal no wheeze , rales or rhonchi. No chest wall deformities or tenderness. Breast: normal by inspection . No dimpling, discharge,  masses, tenderness or discharge . CV: PMI is nondisplaced, S1 S2 no gallops, murmurs, rubs. Peripheral pulses are full without delay.No JVD .  ABDOMEN: Bowel sounds normal nontender  No guard or rebound, no hepato splenomegal no CVA tenderness.  No hernia. Extremtities:  No clubbing cyanosis or edema, no acute joint swelling or redness no focal atrophy NEURO:  Oriented x3, cranial nerves 3-12 appear to be intact, no obvious focal weakness,gait within normal limits no abnormal reflexes or asymmetrical SKIN: No acute rashes normal turgor, color, no bruising or petechiae. PSYCH: Oriented, good eye contact, no obvious depression anxiety, cognition and judgment appear normal. LN: no cervical axillary inguinal adenopathy  Lab Results  Component Value Date   WBC 8.5 04/25/2013   HGB 14.2 04/25/2013   HCT 41.9 04/25/2013   PLT 388.0 04/25/2013   GLUCOSE 85 04/25/2013   CHOL 220* 04/25/2013   TRIG 167.0* 04/25/2013   HDL 74.10 04/25/2013   LDLDIRECT 121.0 04/25/2013   LDLCALC 116* 12/19/2010   ALT 18 04/25/2013   AST 19 04/25/2013   NA 136 04/25/2013   K 3.7 04/25/2013   CL 104 04/25/2013   CREATININE 0.7 04/25/2013   BUN 9 04/25/2013   CO2 25 04/25/2013   TSH 2.91 04/25/2013    ASSESSMENT AND PLAN:  Discussed the following assessment and plan:  Visit for preventive health examination - form signed  Other and unspecified hyperlipidemia - ratio of 3  mild elevation of tg   Sinusitis, acute maxillary - partly rx   add flonase and agumentin  Expectant management followup contact if the right-sided face pain is not improving in and of medication. Counseled regarding healthy nutrition, exercise, sleep, injury prevention, calcium vit d and healthy weight . Form completed Patient Care Team: Madelin Headings, MD as PCP - General Zelphia Cairo, MD as Consulting Physician (Obstetrics and Gynecology) Wonda Amis, MD as Consulting Physician (Dermatology) Patient Instructions  Change to broader  spectrum antibiotic  And add the flonase to help   Resolve the infection.  Continue lifestyle intervention healthy eating and exercise .  Preventive Care for Adults, Female A healthy lifestyle and preventive care can promote health and wellness. Preventive health guidelines for women include the following key practices.  A routine yearly physical is a good way to check with your caregiver about your health and preventive screening. It is a chance to share any concerns and updates on your health, and to receive a thorough exam.  Visit your dentist for a routine exam and preventive care every 6 months. Brush your teeth twice a day and floss once a day.  Good oral hygiene prevents tooth decay and gum disease.  The frequency of eye exams is based on your age, health, family medical history, use of contact lenses, and other factors. Follow your caregiver's recommendations for frequency of eye exams.  Eat a healthy diet. Foods like vegetables, fruits, whole grains, low-fat dairy products, and lean protein foods contain the nutrients you need without too many calories. Decrease your intake of foods high in solid fats, added sugars, and salt. Eat the right amount of calories for you.Get information about a proper diet from your caregiver, if necessary.  Regular physical exercise is one of the most important things you can do for your health. Most adults should get at least 150 minutes of moderate-intensity exercise (any activity that increases your heart rate and causes you to sweat) each week. In addition, most adults need muscle-strengthening exercises on 2 or more days a week.  Maintain a healthy weight. The body mass index (BMI) is a screening tool to identify possible weight problems. It provides an estimate of body fat based on height and weight. Your caregiver can help determine your BMI, and can help you achieve or maintain a healthy weight.For adults 20 years and older:  A BMI below 18.5 is  considered underweight.  A BMI of 18.5 to 24.9 is normal.  A BMI of 25 to 29.9 is considered overweight.  A BMI of 30 and above is considered obese.  Maintain normal blood lipids and cholesterol levels by exercising and minimizing your intake of saturated fat. Eat a balanced diet with plenty of fruit and vegetables. Blood tests for lipids and cholesterol should begin at age 31 and be repeated every 5 years. If your lipid or cholesterol levels are high, you are over 50, or you are at high risk for heart disease, you may need your cholesterol levels checked more frequently.Ongoing high lipid and cholesterol levels should be treated with medicines if diet and exercise are not effective.  If you smoke, find out from your caregiver how to quit. If you do not use tobacco, do not start.  If you are pregnant, do not drink alcohol. If you are breastfeeding, be very cautious about drinking alcohol. If you are not pregnant and choose to drink alcohol, do not exceed 1 drink per day. One drink is considered to be 12 ounces (355 mL) of beer, 5 ounces (148 mL) of wine, or 1.5 ounces (44 mL) of liquor.  Avoid use of street drugs. Do not share needles with anyone. Ask for help if you need support or instructions about stopping the use of drugs.  High blood pressure causes heart disease and increases the risk of stroke. Your blood pressure should be checked at least every 1 to 2 years. Ongoing high blood pressure should be treated with medicines if weight loss and exercise are not effective.  If you are 66 to 45 years old, ask your caregiver if you should take aspirin to prevent strokes.  Diabetes screening involves taking a blood sample to check your fasting blood sugar level. This should be done once every 3 years, after age 45, if you are within normal weight and without risk factors for diabetes. Testing should be considered at a younger age or be carried out more frequently if you are overweight and have at  least 1 risk factor for diabetes.  Breast cancer screening is essential preventive care for women. You should practice "breast self-awareness." This means understanding the normal appearance and feel of your breasts  and may include breast self-examination. Any changes detected, no matter how small, should be reported to a caregiver. Women in their 36s and 30s should have a clinical breast exam (CBE) by a caregiver as part of a regular health exam every 1 to 3 years. After age 93, women should have a CBE every year. Starting at age 53, women should consider having a mammography (breast X-ray test) every year. Women who have a family history of breast cancer should talk to their caregiver about genetic screening. Women at a high risk of breast cancer should talk to their caregivers about having magnetic resonance imaging (MRI) and a mammography every year.  The Pap test is a screening test for cervical cancer. A Pap test can show cell changes on the cervix that might become cervical cancer if left untreated. A Pap test is a procedure in which cells are obtained and examined from the lower end of the uterus (cervix).  Women should have a Pap test starting at age 38.  Between ages 74 and 72, Pap tests should be repeated every 2 years.  Beginning at age 62, you should have a Pap test every 3 years as long as the past 3 Pap tests have been normal.  Some women have medical problems that increase the chance of getting cervical cancer. Talk to your caregiver about these problems. It is especially important to talk to your caregiver if a new problem develops soon after your last Pap test. In these cases, your caregiver may recommend more frequent screening and Pap tests.  The above recommendations are the same for women who have or have not gotten the vaccine for human papillomavirus (HPV).  If you had a hysterectomy for a problem that was not cancer or a condition that could lead to cancer, then you no longer  need Pap tests. Even if you no longer need a Pap test, a regular exam is a good idea to make sure no other problems are starting.  If you are between ages 22 and 69, and you have had normal Pap tests going back 10 years, you no longer need Pap tests. Even if you no longer need a Pap test, a regular exam is a good idea to make sure no other problems are starting.  If you have had past treatment for cervical cancer or a condition that could lead to cancer, you need Pap tests and screening for cancer for at least 20 years after your treatment.  If Pap tests have been discontinued, risk factors (such as a new sexual partner) need to be reassessed to determine if screening should be resumed.  The HPV test is an additional test that may be used for cervical cancer screening. The HPV test looks for the virus that can cause the cell changes on the cervix. The cells collected during the Pap test can be tested for HPV. The HPV test could be used to screen women aged 23 years and older, and should be used in women of any age who have unclear Pap test results. After the age of 41, women should have HPV testing at the same frequency as a Pap test.  Colorectal cancer can be detected and often prevented. Most routine colorectal cancer screening begins at the age of 31 and continues through age 12. However, your caregiver may recommend screening at an earlier age if you have risk factors for colon cancer. On a yearly basis, your caregiver may provide home test kits to check for hidden blood  in the stool. Use of a small camera at the end of a tube, to directly examine the colon (sigmoidoscopy or colonoscopy), can detect the earliest forms of colorectal cancer. Talk to your caregiver about this at age 70, when routine screening begins. Direct examination of the colon should be repeated every 5 to 10 years through age 30, unless early forms of pre-cancerous polyps or small growths are found.  Hepatitis C blood testing is  recommended for all people born from [birth year] through 1963 and any individual with known risks for hepatitis C.  Practice safe sex. Use condoms and avoid high-risk sexual practices to reduce the spread of sexually transmitted infections (STIs). STIs include gonorrhea, chlamydia, syphilis, trichomonas, herpes, HPV, and human immunodeficiency virus (HIV). Herpes, HIV, and HPV are viral illnesses that have no cure. They can result in disability, cancer, and death. Sexually active women aged 9 and younger should be checked for chlamydia. Older women with new or multiple partners should also be tested for chlamydia. Testing for other STIs is recommended if you are sexually active and at increased risk.  Osteoporosis is a disease in which the bones lose minerals and strength with aging. This can result in serious bone fractures. The risk of osteoporosis can be identified using a bone density scan. Women ages 65 and over and women at risk for fractures or osteoporosis should discuss screening with their caregivers. Ask your caregiver whether you should take a calcium supplement or vitamin D to reduce the rate of osteoporosis.  Menopause can be associated with physical symptoms and risks. Hormone replacement therapy is available to decrease symptoms and risks. You should talk to your caregiver about whether hormone replacement therapy is right for you.  Use sunscreen with sun protection factor (SPF) of 30 or more. Apply sunscreen liberally and repeatedly throughout the day. You should seek shade when your shadow is shorter than you. Protect yourself by wearing long sleeves, pants, a wide-brimmed hat, and sunglasses year round, whenever you are outdoors.  Once a month, do a whole body skin exam, using a mirror to look at the skin on your back. Notify your caregiver of new moles, moles that have irregular borders, moles that are larger than a pencil eraser, or moles that have changed in shape or color.  Stay current  with required immunizations.  Influenza. You need a dose every fall (or winter). The composition of the flu vaccine changes each year, so being vaccinated once is not enough.  Pneumococcal polysaccharide. You need 1 to 2 doses if you smoke cigarettes or if you have certain chronic medical conditions. You need 1 dose at age 10 (or older) if you have never been vaccinated.  Tetanus, diphtheria, pertussis (Tdap, Td). Get 1 dose of Tdap vaccine if you are younger than age 58, are over 34 and have contact with an infant, are a Research scientist (physical sciences), are pregnant, or simply want to be protected from whooping cough. After that, you need a Td booster dose every 10 years. Consult your caregiver if you have not had at least 3 tetanus and diphtheria-containing shots sometime in your life or have a deep or dirty wound.  HPV. You need this vaccine if you are a woman age 32 or younger. The vaccine is given in 3 doses over 6 months.  Measles, mumps, rubella (MMR). You need at least 1 dose of MMR if you were born in 1957 or later. You may also need a second dose.  Meningococcal. If you are age  19 to 21 and a Orthoptist living in a residence hall, or have one of several medical conditions, you need to get vaccinated against meningococcal disease. You may also need additional booster doses.  Zoster (shingles). If you are age 28 or older, you should get this vaccine.  Varicella (chickenpox). If you have never had chickenpox or you were vaccinated but received only 1 dose, talk to your caregiver to find out if you need this vaccine.  Hepatitis A. You need this vaccine if you have a specific risk factor for hepatitis A virus infection or you simply wish to be protected from this disease. The vaccine is usually given as 2 doses, 6 to 18 months apart.  Hepatitis B. You need this vaccine if you have a specific risk factor for hepatitis B virus infection or you simply wish to be protected from this disease.  The vaccine is given in 3 doses, usually over 6 months.     Neta Mends. Roena Sassaman M.D.   Health Maintenance  Topic Date Due  . Influenza Vaccine  04/10/2014  . Tetanus/tdap  08/10/2014  . Pap Smear  11/09/2015   Health Maintenance Review

## 2013-05-10 NOTE — Patient Instructions (Signed)
Change to broader spectrum antibiotic  And add the flonase to help   Resolve the infection.  Continue lifestyle intervention healthy eating and exercise .  Preventive Care for Adults, Female A healthy lifestyle and preventive care can promote health and wellness. Preventive health guidelines for women include the following key practices.  A routine yearly physical is a good way to check with your caregiver about your health and preventive screening. It is a chance to share any concerns and updates on your health, and to receive a thorough exam.  Visit your dentist for a routine exam and preventive care every 6 months. Brush your teeth twice a day and floss once a day. Good oral hygiene prevents tooth decay and gum disease.  The frequency of eye exams is based on your age, health, family medical history, use of contact lenses, and other factors. Follow your caregiver's recommendations for frequency of eye exams.  Eat a healthy diet. Foods like vegetables, fruits, whole grains, low-fat dairy products, and lean protein foods contain the nutrients you need without too many calories. Decrease your intake of foods high in solid fats, added sugars, and salt. Eat the right amount of calories for you.Get information about a proper diet from your caregiver, if necessary.  Regular physical exercise is one of the most important things you can do for your health. Most adults should get at least 150 minutes of moderate-intensity exercise (any activity that increases your heart rate and causes you to sweat) each week. In addition, most adults need muscle-strengthening exercises on 2 or more days a week.  Maintain a healthy weight. The body mass index (BMI) is a screening tool to identify possible weight problems. It provides an estimate of body fat based on height and weight. Your caregiver can help determine your BMI, and can help you achieve or maintain a healthy weight.For adults 20 years and older:  A BMI  below 18.5 is considered underweight.  A BMI of 18.5 to 24.9 is normal.  A BMI of 25 to 29.9 is considered overweight.  A BMI of 30 and above is considered obese.  Maintain normal blood lipids and cholesterol levels by exercising and minimizing your intake of saturated fat. Eat a balanced diet with plenty of fruit and vegetables. Blood tests for lipids and cholesterol should begin at age 91 and be repeated every 5 years. If your lipid or cholesterol levels are high, you are over 50, or you are at high risk for heart disease, you may need your cholesterol levels checked more frequently.Ongoing high lipid and cholesterol levels should be treated with medicines if diet and exercise are not effective.  If you smoke, find out from your caregiver how to quit. If you do not use tobacco, do not start.  If you are pregnant, do not drink alcohol. If you are breastfeeding, be very cautious about drinking alcohol. If you are not pregnant and choose to drink alcohol, do not exceed 1 drink per day. One drink is considered to be 12 ounces (355 mL) of beer, 5 ounces (148 mL) of wine, or 1.5 ounces (44 mL) of liquor.  Avoid use of street drugs. Do not share needles with anyone. Ask for help if you need support or instructions about stopping the use of drugs.  High blood pressure causes heart disease and increases the risk of stroke. Your blood pressure should be checked at least every 1 to 2 years. Ongoing high blood pressure should be treated with medicines if weight loss  and exercise are not effective.  If you are 22 to 45 years old, ask your caregiver if you should take aspirin to prevent strokes.  Diabetes screening involves taking a blood sample to check your fasting blood sugar level. This should be done once every 3 years, after age 71, if you are within normal weight and without risk factors for diabetes. Testing should be considered at a younger age or be carried out more frequently if you are  overweight and have at least 1 risk factor for diabetes.  Breast cancer screening is essential preventive care for women. You should practice "breast self-awareness." This means understanding the normal appearance and feel of your breasts and may include breast self-examination. Any changes detected, no matter how small, should be reported to a caregiver. Women in their 66s and 30s should have a clinical breast exam (CBE) by a caregiver as part of a regular health exam every 1 to 3 years. After age 15, women should have a CBE every year. Starting at age 25, women should consider having a mammography (breast X-ray test) every year. Women who have a family history of breast cancer should talk to their caregiver about genetic screening. Women at a high risk of breast cancer should talk to their caregivers about having magnetic resonance imaging (MRI) and a mammography every year.  The Pap test is a screening test for cervical cancer. A Pap test can show cell changes on the cervix that might become cervical cancer if left untreated. A Pap test is a procedure in which cells are obtained and examined from the lower end of the uterus (cervix).  Women should have a Pap test starting at age 62.  Between ages 54 and 51, Pap tests should be repeated every 2 years.  Beginning at age 26, you should have a Pap test every 3 years as long as the past 3 Pap tests have been normal.  Some women have medical problems that increase the chance of getting cervical cancer. Talk to your caregiver about these problems. It is especially important to talk to your caregiver if a new problem develops soon after your last Pap test. In these cases, your caregiver may recommend more frequent screening and Pap tests.  The above recommendations are the same for women who have or have not gotten the vaccine for human papillomavirus (HPV).  If you had a hysterectomy for a problem that was not cancer or a condition that could lead to  cancer, then you no longer need Pap tests. Even if you no longer need a Pap test, a regular exam is a good idea to make sure no other problems are starting.  If you are between ages 52 and 42, and you have had normal Pap tests going back 10 years, you no longer need Pap tests. Even if you no longer need a Pap test, a regular exam is a good idea to make sure no other problems are starting.  If you have had past treatment for cervical cancer or a condition that could lead to cancer, you need Pap tests and screening for cancer for at least 20 years after your treatment.  If Pap tests have been discontinued, risk factors (such as a new sexual partner) need to be reassessed to determine if screening should be resumed.  The HPV test is an additional test that may be used for cervical cancer screening. The HPV test looks for the virus that can cause the cell changes on the cervix. The  cells collected during the Pap test can be tested for HPV. The HPV test could be used to screen women aged 109 years and older, and should be used in women of any age who have unclear Pap test results. After the age of 76, women should have HPV testing at the same frequency as a Pap test.  Colorectal cancer can be detected and often prevented. Most routine colorectal cancer screening begins at the age of 11 and continues through age 71. However, your caregiver may recommend screening at an earlier age if you have risk factors for colon cancer. On a yearly basis, your caregiver may provide home test kits to check for hidden blood in the stool. Use of a small camera at the end of a tube, to directly examine the colon (sigmoidoscopy or colonoscopy), can detect the earliest forms of colorectal cancer. Talk to your caregiver about this at age 61, when routine screening begins. Direct examination of the colon should be repeated every 5 to 10 years through age 38, unless early forms of pre-cancerous polyps or small growths are  found.  Hepatitis C blood testing is recommended for all people born from [birth year] through 1963 and any individual with known risks for hepatitis C.  Practice safe sex. Use condoms and avoid high-risk sexual practices to reduce the spread of sexually transmitted infections (STIs). STIs include gonorrhea, chlamydia, syphilis, trichomonas, herpes, HPV, and human immunodeficiency virus (HIV). Herpes, HIV, and HPV are viral illnesses that have no cure. They can result in disability, cancer, and death. Sexually active women aged 7 and younger should be checked for chlamydia. Older women with new or multiple partners should also be tested for chlamydia. Testing for other STIs is recommended if you are sexually active and at increased risk.  Osteoporosis is a disease in which the bones lose minerals and strength with aging. This can result in serious bone fractures. The risk of osteoporosis can be identified using a bone density scan. Women ages 50 and over and women at risk for fractures or osteoporosis should discuss screening with their caregivers. Ask your caregiver whether you should take a calcium supplement or vitamin D to reduce the rate of osteoporosis.  Menopause can be associated with physical symptoms and risks. Hormone replacement therapy is available to decrease symptoms and risks. You should talk to your caregiver about whether hormone replacement therapy is right for you.  Use sunscreen with sun protection factor (SPF) of 30 or more. Apply sunscreen liberally and repeatedly throughout the day. You should seek shade when your shadow is shorter than you. Protect yourself by wearing long sleeves, pants, a wide-brimmed hat, and sunglasses year round, whenever you are outdoors.  Once a month, do a whole body skin exam, using a mirror to look at the skin on your back. Notify your caregiver of new moles, moles that have irregular borders, moles that are larger than a pencil eraser, or moles that have  changed in shape or color.  Stay current with required immunizations.  Influenza. You need a dose every fall (or winter). The composition of the flu vaccine changes each year, so being vaccinated once is not enough.  Pneumococcal polysaccharide. You need 1 to 2 doses if you smoke cigarettes or if you have certain chronic medical conditions. You need 1 dose at age 4 (or older) if you have never been vaccinated.  Tetanus, diphtheria, pertussis (Tdap, Td). Get 1 dose of Tdap vaccine if you are younger than age 33, are over 5  and have contact with an infant, are a Research scientist (physical sciences), are pregnant, or simply want to be protected from whooping cough. After that, you need a Td booster dose every 10 years. Consult your caregiver if you have not had at least 3 tetanus and diphtheria-containing shots sometime in your life or have a deep or dirty wound.  HPV. You need this vaccine if you are a woman age 61 or younger. The vaccine is given in 3 doses over 6 months.  Measles, mumps, rubella (MMR). You need at least 1 dose of MMR if you were born in 1957 or later. You may also need a second dose.  Meningococcal. If you are age 6 to 51 and a first-year college student living in a residence hall, or have one of several medical conditions, you need to get vaccinated against meningococcal disease. You may also need additional booster doses.  Zoster (shingles). If you are age 71 or older, you should get this vaccine.  Varicella (chickenpox). If you have never had chickenpox or you were vaccinated but received only 1 dose, talk to your caregiver to find out if you need this vaccine.  Hepatitis A. You need this vaccine if you have a specific risk factor for hepatitis A virus infection or you simply wish to be protected from this disease. The vaccine is usually given as 2 doses, 6 to 18 months apart.  Hepatitis B. You need this vaccine if you have a specific risk factor for hepatitis B virus infection or you  simply wish to be protected from this disease. The vaccine is given in 3 doses, usually over 6 months.

## 2013-05-22 ENCOUNTER — Other Ambulatory Visit: Payer: Self-pay | Admitting: Family Medicine

## 2013-05-24 ENCOUNTER — Telehealth: Payer: Self-pay | Admitting: Internal Medicine

## 2013-05-24 NOTE — Telephone Encounter (Signed)
RN tried to return a call to the patient who had called regarding a prescription refill.  There was no answer and RN left message on pt phone to call office back

## 2013-05-24 NOTE — Telephone Encounter (Signed)
Did you confirm with the patient what is needed?

## 2013-05-24 NOTE — Telephone Encounter (Signed)
Pt called to inquire about this refill request. She states that she had a hard time getting it last time because Dr. Fabian Sharp was on vacation. This time, the pharmacy sent the refill request to Dr. Selena Batten because that's who did it in place of Dr. Fabian Sharp last time. The pt politely asks that we extradite this as quickly as possible, thank you for your assistance!

## 2013-05-24 NOTE — Telephone Encounter (Signed)
Yes, she's inquiring about her refill request.

## 2013-06-29 ENCOUNTER — Other Ambulatory Visit: Payer: Self-pay | Admitting: Internal Medicine

## 2013-07-03 NOTE — Telephone Encounter (Signed)
Ok to refill xanax x 1  Ibuprofen x 2

## 2013-07-03 NOTE — Telephone Encounter (Signed)
CPE on 05/10/13.  Please advise.  Thanks!

## 2013-08-10 HISTORY — PX: OTHER SURGICAL HISTORY: SHX169

## 2013-08-10 HISTORY — PX: WRIST FRACTURE SURGERY: SHX121

## 2013-08-16 ENCOUNTER — Ambulatory Visit (INDEPENDENT_AMBULATORY_CARE_PROVIDER_SITE_OTHER): Payer: BC Managed Care – PPO | Admitting: Family Medicine

## 2013-08-16 ENCOUNTER — Encounter: Payer: Self-pay | Admitting: Family Medicine

## 2013-08-16 VITALS — BP 110/72 | Temp 99.6°F | Wt 166.0 lb

## 2013-08-16 DIAGNOSIS — J329 Chronic sinusitis, unspecified: Secondary | ICD-10-CM

## 2013-08-16 MED ORDER — AMOXICILLIN-POT CLAVULANATE 875-125 MG PO TABS: 1.0000 | ORAL_TABLET | Freq: Two times a day (BID) | ORAL | Status: DC

## 2013-08-16 NOTE — Progress Notes (Signed)
Pre visit review using our clinic review tool, if applicable. No additional management support is needed unless otherwise documented below in the visit note. 

## 2013-08-16 NOTE — Progress Notes (Signed)
Chief Complaint  Patient presents with  . Sinusitis    eye infection    HPI:  -started: 2 weeks ago with sinus congestion, nasal discharge, cough, drainage but now worsening with R maxillary sinus pain and R upper tooth pain -had sty on eye a few months ago and wondered if max pain could be because another sty starting  -denies:fever, SOB, NVD, eye drainage, visual disturbance swelling around eye -sick contacts/travel/risks: denies flu exposure or Ebola risks -Hx of: allergies andsinusitis ROS: See pertinent positives and negatives per HPI.  Past Medical History  Diagnosis Date  . History of DVT (deep vein thrombosis)     2006   sp ankle fracture  immobilization   . Fracture     left ankle  . Allergic rhinitis     No past surgical history on file.  Family History  Problem Relation Age of Onset  . Migraines Mother   . COPD Father     smoker  . Parkinsonism Father     History   Social History  . Marital Status: Married    Spouse Name: N/A    Number of Children: N/A  . Years of Education: N/A   Social History Main Topics  . Smoking status: Former Research scientist (life sciences)  . Smokeless tobacco: None  . Alcohol Use: 3.6 oz/week    6 Glasses of wine per week  . Drug Use: No  . Sexual Activity:    Other Topics Concern  . 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     Works 40 hours per week .   Married  HHof 2       Regular exercise-no   Vegan soy milk     ocass etoh 2-3 etoh neg tobacco 1-2 tea    8 hours sleep     Current outpatient prescriptions:ALPRAZolam (XANAX) 0.25 MG tablet, TAKE ONE TABLET BY MOUTH AT BEDTIME AS NEEDED , Disp: 30 tablet, Rfl: 0;  amoxicillin-clavulanate (AUGMENTIN) 875-125 MG per tablet, Take 1 tablet by mouth every 12 (twelve) hours. For sinusitis, Disp: 20 tablet, Rfl: 0;  fexofenadine (ALLEGRA) 180 MG tablet, Take 180 mg by mouth daily.  , Disp: , Rfl:  fluticasone  (FLONASE) 50 MCG/ACT nasal spray, Place 2 sprays into the nose daily., Disp: 16 g, Rfl: 5;  ibuprofen (ADVIL,MOTRIN) 800 MG tablet, Take one tablet by mouth every eight hours as needed, Disp: 30 tablet, Rfl: 1;  MULTIPLE VITAMIN PO, Take by mouth.  , Disp: , Rfl: ;  amoxicillin-clavulanate (AUGMENTIN) 875-125 MG per tablet, Take 1 tablet by mouth 2 (two) times daily., Disp: 20 tablet, Rfl: 0  EXAM:  Filed Vitals:   08/16/13 0912  BP: 110/72  Temp: 99.6 F (37.6 C)    Body mass index is 28.06 kg/(m^2).  GENERAL: vitals reviewed and listed above, alert, oriented, appears well hydrated and in no acute distress  HEENT: atraumatic, conjunttiva clear, PERRLA, visual acuity grossly intact, no obvious abnormalities on inspection of external nose and ears, normal appearance of ear canals and TMs, clear nasal congestion, mild post oropharyngeal erythema with PND, no tonsillar edema or exudate, no sinus TTP  NECK: no obvious masses on inspection  LUNGS: clear to auscultation bilaterally, no wheezes, rales or rhonchi, good air movement  CV: HRRR, no peripheral edema  MS: moves all extremities without noticeable abnormality  PSYCH: pleasant and cooperative, no obvious depression or anxiety  ASSESSMENT AND PLAN:  Discussed  the following assessment and plan:  Sinusitis - Plan: amoxicillin-clavulanate (AUGMENTIN) 875-125 MG per tablet  -We discussed potential etiologies, with sinusitis being most likely. I do not see any signs of recurring sty or eye infection. We discussed treatment side effects, likely course, antibiotic misuse, transmission, and signs of developing a serious illness. -if recurrent sinusitis should see PCP and may need to see ENT -of course, we advised to return or notify a doctor immediately if symptoms worsen or persist or new concerns arise.    There are no Patient Instructions on file for this visit.   Colin Benton R.

## 2013-08-17 ENCOUNTER — Ambulatory Visit: Payer: BC Managed Care – PPO | Admitting: Internal Medicine

## 2013-09-06 ENCOUNTER — Ambulatory Visit (INDEPENDENT_AMBULATORY_CARE_PROVIDER_SITE_OTHER): Payer: BC Managed Care – PPO | Admitting: Family Medicine

## 2013-09-06 ENCOUNTER — Encounter: Payer: Self-pay | Admitting: Family Medicine

## 2013-09-06 VITALS — BP 110/76 | Temp 99.1°F | Wt 168.0 lb

## 2013-09-06 DIAGNOSIS — J3489 Other specified disorders of nose and nasal sinuses: Secondary | ICD-10-CM

## 2013-09-06 DIAGNOSIS — J309 Allergic rhinitis, unspecified: Secondary | ICD-10-CM

## 2013-09-06 DIAGNOSIS — R0981 Nasal congestion: Secondary | ICD-10-CM

## 2013-09-06 NOTE — Progress Notes (Signed)
Chief Complaint  Patient presents with  . sinus pressure/drainage    sore throat, trouble swallowing last nigh     HPI:   -started: several weeks ago and treated with augmentin and improved  -per notes several sinus infections in last 6 months most recenlty on 1/7 treated with Augmentin and I advised her at last visit to follow up with PCP or ENT if recurred or persisted -she reports sinus pain improved but then got a little sinus pressure and drainage in throat 3 days ago -symptoms:nasal congestion, drainage, sinus pressure -denies:fever, SOB, NVD, tooth pain, sinus pain -sick contacts/travel/risks: denies flu exposure or Ebola risks -Hx of: allergies and recurrent sinus infection  ROS: See pertinent positives and negatives per HPI.  Past Medical History  Diagnosis Date  . History of DVT (deep vein thrombosis)     2006   sp ankle fracture  immobilization   . Fracture     left ankle  . Allergic rhinitis     No past surgical history on file.  Family History  Problem Relation Age of Onset  . Migraines Mother   . COPD Father     smoker  . Parkinsonism Father     History   Social History  . Marital Status: Married    Spouse Name: N/A    Number of Children: N/A  . Years of Education: N/A   Social History Main Topics  . Smoking status: Former Research scientist (life sciences)  . Smokeless tobacco: None  . Alcohol Use: 3.6 oz/week    6 Glasses of wine per week  . Drug Use: No  . Sexual Activity:    Other Topics Concern  . 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     Works 40 hours per week .   Married  HHof 2       Regular exercise-no   Vegan soy milk     ocass etoh 2-3 etoh neg tobacco 1-2 tea    8 hours sleep     Current outpatient prescriptions:ALPRAZolam (XANAX) 0.25 MG tablet, TAKE ONE TABLET BY MOUTH AT BEDTIME AS NEEDED , Disp: 30 tablet, Rfl: 0;  fexofenadine (ALLEGRA) 180 MG tablet, Take 180  mg by mouth daily.  , Disp: , Rfl: ;  fluticasone (FLONASE) 50 MCG/ACT nasal spray, Place 2 sprays into the nose daily., Disp: 16 g, Rfl: 5;  ibuprofen (ADVIL,MOTRIN) 800 MG tablet, Take one tablet by mouth every eight hours as needed, Disp: 30 tablet, Rfl: 1 MULTIPLE VITAMIN PO, Take by mouth.  , Disp: , Rfl:   EXAM:  Filed Vitals:   09/06/13 1118  BP: 110/76  Temp: 99.1 F (37.3 C)    Body mass index is 28.4 kg/(m^2).  GENERAL: vitals reviewed and listed above, alert, oriented, appears well hydrated and in no acute distress  HEENT: atraumatic, conjunttiva clear, no obvious abnormalities on inspection of external nose and ears, normal appearance of ear canals and TMs, clear nasal congestion, mild post oropharyngeal erythema with PND, no tonsillar edema or exudate, no sinus TTP  NECK: no obvious masses on inspection  LUNGS: clear to auscultation bilaterally, no wheezes, rales or rhonchi, good air movement  CV: HRRR, no peripheral edema  MS: moves all extremities without noticeable abnormality  PSYCH: pleasant and cooperative, no obvious depression or anxiety  ASSESSMENT AND PLAN:  Discussed the following assessment and plan:  ALLERGIC RHINITIS  Sinus congestion  -mild sinus congestion, PND -  no fevers, chills, sinus pain -chronic problems with sinus congestion, allergic rhinitis and recurrent sinusitis - advised given recurrent sinus infections that she may wish to see an ENT doctor -she was frustrated about this recommendation and when I tried to figure out why she reports it is because I should have told her at the last visit that she should see an ENT doctor if recurs so that she did not have to "waste her time coming here" -per my notes and my recollection I had done exactly that at the last visit but she does not recall this -I apologized and asked how I could help her and explained that coming here today was wise to find out what was going on (new issues, new symptoms,  improvement, etc) and she said she will look into whom she wants to see in ENT and call for a referral -of course, we advised to return or notify a doctor immediately if symptoms worsen or persist or new concerns arise.    There are no Patient Instructions on file for this visit.   Colin Benton R.  mn

## 2013-09-06 NOTE — Assessment & Plan Note (Deleted)
stable °

## 2013-09-06 NOTE — Progress Notes (Signed)
Pre visit review using our clinic review tool, if applicable. No additional management support is needed unless otherwise documented below in the visit note. 

## 2013-09-28 ENCOUNTER — Other Ambulatory Visit: Payer: Self-pay

## 2013-09-28 DIAGNOSIS — Z1231 Encounter for screening mammogram for malignant neoplasm of breast: Secondary | ICD-10-CM

## 2013-11-13 ENCOUNTER — Encounter: Payer: Self-pay | Admitting: *Deleted

## 2013-11-15 ENCOUNTER — Encounter: Payer: Self-pay | Admitting: Neurology

## 2013-11-15 ENCOUNTER — Other Ambulatory Visit: Payer: Self-pay | Admitting: Neurology

## 2013-11-15 ENCOUNTER — Ambulatory Visit (INDEPENDENT_AMBULATORY_CARE_PROVIDER_SITE_OTHER): Payer: BC Managed Care – PPO | Admitting: Neurology

## 2013-11-15 VITALS — BP 112/76 | HR 75 | Resp 16 | Ht 66.0 in | Wt 170.0 lb

## 2013-11-15 DIAGNOSIS — R519 Headache, unspecified: Secondary | ICD-10-CM

## 2013-11-15 DIAGNOSIS — G43909 Migraine, unspecified, not intractable, without status migrainosus: Secondary | ICD-10-CM

## 2013-11-15 DIAGNOSIS — R51 Headache: Secondary | ICD-10-CM

## 2013-11-15 DIAGNOSIS — M26609 Unspecified temporomandibular joint disorder, unspecified side: Secondary | ICD-10-CM

## 2013-11-15 LAB — C-REACTIVE PROTEIN

## 2013-11-15 MED ORDER — SUMATRIPTAN SUCCINATE 25 MG PO TABS: 25.0000 mg | ORAL_TABLET | ORAL | Status: DC | PRN

## 2013-11-15 NOTE — Progress Notes (Signed)
[facility]  Provider:  Larey Seat, M D  Referring Provider: Burnis Medin, MD Primary Care Physician:  Lottie Dawson, MD  Chief Complaint  Patient presents with  . New Evaluation    Room 10  . Headache    HPI:  Kendra Fox is a 48 y.o. female  Is seen here as a referral  from Dr. Regis Bill  and her ENT physician , Dr. Jenetta Loges, for  an atypical facial pain.  Kendra Fox is a right-handed, Caucasian female that has been evaluated for what was present to be sinus headache. She has pain in the right maxilla occasionally some nasal congestion and was treated with an antibiotic biotic which only helped a little and only temporarily. Last year she also had a similar spell but in autumn.  At that time , she  found relief with antibiotic treatment.  She really doesn't have a history of prolonged sinusitis or sinus problems,  she has no history of bony abnormalities or structural injuries to the facial skull or airway.  She had noticed some discomfort in her right temporal mandibular joint and  her dentist has assured her that there are no signs of bruxism when examining her 14 days ago.  A sinus film a CT of the sinuses was obtained through Dr. Jenetta Loges, negative for sinusitis.  The patient is a nonsmoker she quit smoking over 10 years ago she does not use alcohol and she uses caffeine in the morning only. She had one to extraction surgery began nor surgery or trauma to the neck airway or facial skull.  The pain is of a latent quality, comes and goes, doesn't wake her from sleep nor does she wake up and notices pain first.  She has noticed any triggers. One time she noted the pain coming on after exercise, and associated with a right visual aura of zick -zack lines of light in the peripheral field. No nausea, no phonophobia , but photophobia , a slight dizziness, light headedness. There seems no trigger in activity, walking etc. She has not lost ability to smell or  taste.  She describes a dull pressure sensation over the right Cheekbone.    Her mother has migraines.           Review of Systems: Out of a complete 14 system review, the patient complains of only the following symptoms, and all other reviewed systems are negative. Right mandibular pain. Visual aura 4 times in the last 3 month. Daily latent feeling of facial pressure. TMJ pain is not present but a click.    History   Social History  . Marital Status: Married    Spouse Name: Kendra Fox    Number of Children: 0  . Years of Education: College   Occupational History  .     Social History Main Topics  . Smoking status: Former Research scientist (life sciences)  . Smokeless tobacco: Never Used     Comment: Quit in 2004  . Alcohol Use: 3.6 oz/week    6 Glasses of wine per week     Comment: social  . Drug Use: No  . Sexual Activity: Not on file   Other Topics Concern  . Not on file   Social History Narrative   Patient is married Kendra Fox)   Patient works at [employer].   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   Married  HHof 2  Regular exercise-no   Vegan soy milk     neg tobacco 1-2 tea    8 hours sleep    Patient is right-handed.   Patient drinks 2-3 cups of coffee daily.   Patient has a college education.    Family History  Problem Relation Age of Onset  . Migraines Mother   . COPD Father     smoker  . Parkinsonism Father     Past Medical History  Diagnosis Date  . History of DVT (deep vein thrombosis)     2006   sp ankle fracture  immobilization   . Fracture     left ankle  . Allergic rhinitis   . Increased severity of headaches     Past Surgical History  Procedure Laterality Date  . Tooth extraction      Current Outpatient Prescriptions  Medication Sig Dispense Refill  . ALPRAZolam (XANAX) 0.25 MG tablet TAKE ONE TABLET BY MOUTH AT BEDTIME AS NEEDED   30 tablet  0  . fexofenadine (ALLEGRA) 180 MG  tablet Take 180 mg by mouth daily.        Marland Kitchen ibuprofen (ADVIL,MOTRIN) 800 MG tablet Take one tablet by mouth every eight hours as needed  30 tablet  1  . MULTIPLE VITAMIN PO Take by mouth.        . triamcinolone (NASACORT AQ) 55 MCG/ACT AERO nasal inhaler Place 2 sprays into the nose daily.       No current facility-administered medications for this visit.    Allergies as of 11/15/2013 - Review Complete 11/15/2013  Allergen Reaction Noted  . Minocycline  11/15/2013    Vitals: BP 112/76  Pulse 75  Resp 16  Ht 5\' 6"  (1.676 m)  Wt 170 lb (77.111 kg)  BMI 27.45 kg/m2  LMP 10/25/2013 Last Weight:  Wt Readings from Last 1 Encounters:  11/15/13 170 lb (77.111 kg)   Last Height:   Ht Readings from Last 1 Encounters:  11/15/13 5\' 6"  (1.676 m)    Physical exam:  General: The patient is awake, alert and appears not in acute distress. The patient is well groomed. Head: Normocephalic, atraumatic. Neck is supple. Mallampati 2 neck circumference: 14.5 , no temporal artery hardening, no delayed swallowing , nasion is patent.  Cardiovascular:  Regular rate and rhythm , without  murmurs or carotid bruit, and without distended neck veins. Respiratory: Lungs are clear to auscultation. Skin:  Without evidence of edema, or rash Trunk: BMI is elevated , normal posture.  Neurologic exam : The patient is awake and alert, oriented to place and time.  Memory subjective described as intact. There is a normal attention span & concentration ability.  Speech is fluent without  dysarthria, dysphonia or aphasia. Mood and affect are appropriate.  Cranial nerves: Pupils are equal and briskly reactive to light. Funduscopic exam without  evidence of pallor or edema. Extraocular movements  in vertical and horizontal planes intact- but  with nystagmus at endpoint in the horizontal plane.  . Visual fields by finger perimetry are intact. Hearing to finger rub intact.  Facial sensation intact to fine touch.  Facial motor strength is symmetric and tongue and uvula move midline.  Motor exam:  Normal tone and normal muscle bulk and symmetric normal strength in all extremities.  Sensory:  Fine touch, pinprick and vibration were tested in all extremities. Proprioception is normal.  Coordination: Rapid alternating movements in the fingers/hands is tested and normal. Finger-to-nose maneuver tested and normal without evidence of ataxia,  dysmetria or tremor.  Gait and station: Patient walks without assistive device and is able and assisted stool climb up to the exam table. Strength within normal limits. Stance is stable and normal. Tandem gait is unimpaired.  Steps are unfragmented. Romberg testing is normal.  Deep tendon reflexes: in the  upper and lower extremities are symmetric and intact. Babinski maneuver response downgoing.   Assessment:  After physical and neurologic examination, review of laboratory studies, imaging, neurophysiology testing and pre-existing records, assessment is  1) I suspect a migraine component , and will try 50 mg Imitrex for 6 days daily , than only if pain occurs.   2) this is a new onset headache over 3 month , Nystagmus , sinusitis was ruled out. -BRAIN  MRI needed.   Plan:  Treatment plan and additional workup : MRI brain without contrast,  amitriptyline for prevention and sleep . Imitrex 25 mg,  possible 2 component headaches.

## 2013-11-15 NOTE — Patient Instructions (Signed)

## 2013-11-16 LAB — COMPREHENSIVE METABOLIC PANEL
A/G RATIO: 1.8 (ref 1.1–2.5)
ALK PHOS: 36 IU/L — AB (ref 39–117)
ALT: 20 IU/L (ref 0–32)
AST: 22 IU/L (ref 0–40)
Albumin: 4.5 g/dL (ref 3.5–5.5)
BUN / CREAT RATIO: 20 (ref 9–23)
BUN: 13 mg/dL (ref 6–24)
CALCIUM: 9.5 mg/dL (ref 8.7–10.2)
CO2: 26 mmol/L (ref 18–29)
CREATININE: 0.65 mg/dL (ref 0.57–1.00)
Chloride: 102 mmol/L (ref 96–108)
GFR, EST AFRICAN AMERICAN: 124 mL/min/{1.73_m2} (ref >59)
GFR, EST NON AFRICAN AMERICAN: 108 mL/min/{1.73_m2} (ref >59)
GLOBULIN, TOTAL: 2.5 g/dL (ref 1.5–4.5)
Glucose: 88 mg/dL (ref 65–99)
POTASSIUM: 4.5 mmol/L (ref 3.5–5.2)
SODIUM: 138 mmol/L (ref 134–144)
Total Bilirubin: 0.4 mg/dL (ref 0.0–1.2)
Total Protein: 7 g/dL (ref 6.0–8.5)

## 2013-11-16 LAB — CBC WITH DIFFERENTIAL/PLATELET
Basophils Absolute: 0 10*3/uL (ref 0.0–0.2)
Basos: 1 %
EOS ABS: 0.2 10*3/uL (ref 0.0–0.4)
Eos: 3 %
HCT: 41.8 % (ref 34.0–46.6)
HEMOGLOBIN: 14.8 g/dL (ref 11.1–15.9)
Lymphocytes Absolute: 1.3 10*3/uL (ref 0.7–3.1)
Lymphs: 19 %
MCH: 30.9 pg (ref 26.6–33.0)
MCHC: 35.4 g/dL (ref 31.5–35.7)
MCV: 87 fL (ref 79–97)
Monocytes Absolute: 0.5 10*3/uL (ref 0.1–0.9)
Monocytes: 8 %
NEUTROS ABS: 4.8 10*3/uL (ref 1.4–7.0)
Neutrophils Relative %: 69 %
RBC: 4.79 x10E6/uL (ref 3.77–5.28)
RDW: 14 % (ref 12.3–15.4)
WBC: 6.9 10*3/uL (ref 3.4–10.8)

## 2013-11-23 ENCOUNTER — Ambulatory Visit (INDEPENDENT_AMBULATORY_CARE_PROVIDER_SITE_OTHER): Payer: BC Managed Care – PPO

## 2013-11-29 ENCOUNTER — Telehealth: Payer: Self-pay | Admitting: Neurology

## 2013-11-29 MED ORDER — ZONISAMIDE 25 MG PO CAPS: 25.0000 mg | ORAL_CAPSULE | Freq: Every day | ORAL | Status: DC

## 2013-11-29 NOTE — Telephone Encounter (Signed)
Pt is calling requesting MRI results and stating that she is still having headaches. Pt would like Dr. Brett Fairy to give her a call back please. Thanks

## 2013-11-29 NOTE — Telephone Encounter (Signed)
Patient calling for results of her MRI and wants to discuss headaches--please call

## 2013-11-29 NOTE — Telephone Encounter (Signed)
Normal MRI of the brain,   i suggested dental evaluation  treatment by a retainer to address TMJ component.   In the meantime , she found some relief from acute pain with Imitrex and keeps taking that medication . I will start her on a prophylactic , not beta blocker , but zonisamide.

## 2013-11-30 ENCOUNTER — Telehealth: Payer: Self-pay | Admitting: Neurology

## 2013-11-30 DIAGNOSIS — M26609 Unspecified temporomandibular joint disorder, unspecified side: Secondary | ICD-10-CM

## 2013-11-30 DIAGNOSIS — G478 Other sleep disorders: Secondary | ICD-10-CM

## 2013-11-30 DIAGNOSIS — G43909 Migraine, unspecified, not intractable, without status migrainosus: Secondary | ICD-10-CM

## 2013-11-30 NOTE — Telephone Encounter (Signed)
Rx was manually faxed to the pharmacy since it was printed.   Patient is also inquiring about a referral.

## 2013-11-30 NOTE — Telephone Encounter (Signed)
Pt called back to see about the new medication that Dr. Brett Fairy was going to start pt on. Please see previous note from Dr. Brett Fairy and also pt would like to go ahead a see if we can send over the referral for the retainer. Please call pt concerning this matter. I do see on the medication zonisamide (ZONEGRAN) 25 MG capsule is showing Print but not sent. Thanks

## 2013-11-30 NOTE — Telephone Encounter (Signed)
Pt calling requesting the referral be put in for the sleeping retainer that was discussed with Dr.Dohmeier in phone note on 11/29/13. Please advise

## 2013-12-01 MED ORDER — ZONISAMIDE 25 MG PO CAPS: 25.0000 mg | ORAL_CAPSULE | Freq: Every day | ORAL | Status: DC

## 2013-12-04 NOTE — Telephone Encounter (Signed)
Sleep apnea dental retainer, mandibular advancement therapy.   Please forward to sleep lab to make referral. CD

## 2013-12-05 NOTE — Telephone Encounter (Signed)
Pt is returning our call concerning the referral information. Please call pt back. Thanks

## 2013-12-05 NOTE — Telephone Encounter (Signed)
The referral information--last office visit, labs and demographics were faxed to [facility].  Left a message for the patient to call the office for the referral information.

## 2013-12-06 NOTE — Telephone Encounter (Signed)
Patient was informed that the referral was sent over to [facility].  Patient called back, they do not do TMJ referrals, had to call [facility] and left a detailed message to call me back to get the patient scheduled.  Awaiting this telephone call.

## 2013-12-07 NOTE — Telephone Encounter (Signed)
I called [facility] and the patient has to call her insurance company and find out if she has TMJ insurance, her deductible, and if a pre-cert is needed because they are a Fee for service office.  Left a detailed message for the patient.

## 2013-12-20 ENCOUNTER — Ambulatory Visit
Admission: RE | Admit: 2013-12-20 | Discharge: 2013-12-20 | Disposition: A | Discharge status: Home / Self Care | Payer: BC Managed Care – PPO | Source: Ambulatory Visit

## 2013-12-20 DIAGNOSIS — Z1231 Encounter for screening mammogram for malignant neoplasm of breast: Secondary | ICD-10-CM

## 2014-01-15 ENCOUNTER — Ambulatory Visit: Payer: BC Managed Care – PPO | Admitting: Nurse Practitioner

## 2014-01-22 ENCOUNTER — Encounter: Payer: Self-pay | Admitting: Nurse Practitioner

## 2014-01-22 ENCOUNTER — Ambulatory Visit (INDEPENDENT_AMBULATORY_CARE_PROVIDER_SITE_OTHER): Payer: BC Managed Care – PPO | Admitting: Nurse Practitioner

## 2014-01-22 VITALS — BP 109/67 | HR 68 | Temp 98.9°F | Ht 66.0 in | Wt 167.0 lb

## 2014-01-22 DIAGNOSIS — R519 Headache, unspecified: Secondary | ICD-10-CM

## 2014-01-22 DIAGNOSIS — G43909 Migraine, unspecified, not intractable, without status migrainosus: Secondary | ICD-10-CM

## 2014-01-22 DIAGNOSIS — R51 Headache: Secondary | ICD-10-CM

## 2014-01-22 DIAGNOSIS — G43109 Migraine with aura, not intractable, without status migrainosus: Secondary | ICD-10-CM

## 2014-01-22 DIAGNOSIS — M26609 Unspecified temporomandibular joint disorder, unspecified side: Secondary | ICD-10-CM

## 2014-01-22 MED ORDER — SUMATRIPTAN SUCCINATE 25 MG PO TABS: 25.0000 mg | ORAL_TABLET | Freq: Once | ORAL | Status: DC

## 2014-01-22 MED ORDER — SUMATRIPTAN SUCCINATE 25 MG PO TABS: 25.0000 mg | ORAL_TABLET | ORAL | Status: DC | PRN

## 2014-01-22 MED ORDER — ZONISAMIDE 25 MG PO CAPS: 25.0000 mg | ORAL_CAPSULE | Freq: Every day | ORAL | Status: DC

## 2014-01-22 NOTE — Patient Instructions (Signed)
Continue Imitrex tablets as needed for acute Migraine.  Continue massage and integrative therapies for headache prevention.  If headaches worsen, start Zonisamide, 1 tablet each night as prevention.    Refills have been sent to [facility] on both medications.  Follow up in our office in 6 months, sooner as needed.  Please call the office with any questions.

## 2014-01-22 NOTE — Progress Notes (Signed)
PATIENT: Kendra Fox DOB: [birth year]  REASON FOR VISIT: routine follow up for facial pain, headache HISTORY FROM: patient  HISTORY OF PRESENT ILLNESS: Kendra Fox is a 48 y.o. female Is seen here as a referral from Dr. Regis Bill and her ENT physician , Dr. Jenetta Loges, for an atypical facial pain.   UPDATE 01/22/14 (LL):  Normal MRI of the brain, Dr. Brett Fairy suggested dental evaluation at a TMJ specialist, [facility] did not accept her insurance.  She went to [facility] and was evaluated and treated with massage and TENS.  Also going to [facility] for occasional massage.  She feels that she has improved, still has mild tension headaches at the end of the day and has had 1 migraine in the last month, relieved with Sumatriptan.  She stopped taking Zonisamide when she started feeling better.   11/15/13 PRIOR HPI (CD):  Kendra Fox is a right-handed, Caucasian female that has been evaluated for what was present to be sinus headache. She has pain in the right maxilla occasionally some nasal congestion and was treated with an antibiotic which only helped a little and only temporarily. Last year she also had a similar spell but in autumn. At that time, she found relief with antibiotic treatment. She really doesn't have a history of prolonged sinusitis or sinus problems, she has no history of bony abnormalities or structural injuries to the facial skull or airway. She had noticed some discomfort in her right temporal mandibular joint and her dentist has assured her that there are no signs of bruxism when examining her 14 days ago. A sinus film a CT of the sinuses was obtained through Dr. Jenetta Loges, negative for sinusitis. The patient is a nonsmoker she quit smoking over 10 years ago she does not use alcohol and she uses caffeine in the morning only. She had one tooth extraction surgery began nor surgery or trauma to the neck airway or facial skull. The pain is of a latent quality, comes and  goes, doesn't wake her from sleep nor does she wake up and notices pain first. She hasn't noticed any triggers. One time she noted the pain coming on after exercise, and associated with a right visual aura of zig -zag lines of light in the peripheral field. No nausea, no phonophobia, but photophobia, a slight dizziness, light headedness. There seems no trigger in activity, walking etc. She has not lost ability to smell or taste. She describes a dull pressure sensation over the right Cheekbone. Her mother has migraines.   Review of Systems:  Out of a complete 14 system review, the patient complains of only the following symptoms, and all other reviewed systems are negative.  Headache  ALLERGIES: Allergies  Allergen Reactions  . Minocycline     HOME MEDICATIONS: Outpatient Prescriptions Prior to Visit  Medication Sig Dispense Refill  . ALPRAZolam (XANAX) 0.25 MG tablet TAKE ONE TABLET BY MOUTH AT BEDTIME AS NEEDED   30 tablet  0  . fexofenadine (ALLEGRA) 180 MG tablet Take 180 mg by mouth daily.        Marland Kitchen ibuprofen (ADVIL,MOTRIN) 800 MG tablet Take one tablet by mouth every eight hours as needed  30 tablet  1  . MULTIPLE VITAMIN PO Take by mouth.        . triamcinolone (NASACORT AQ) 55 MCG/ACT AERO nasal inhaler Place 2 sprays into the nose daily.      . SUMAtriptan (IMITREX) 25 MG tablet Take 1 tablet (25 mg total) by  mouth every 2 (two) hours as needed for migraine or headache. May repeat in 2 hours if headache persists or recurs.  10 tablet  0  . zonisamide (ZONEGRAN) 25 MG capsule Take 1 capsule (25 mg total) by mouth daily.  30 capsule  2   No facility-administered medications prior to visit.     PHYSICAL EXAM  Filed Vitals:   01/22/14 0840  BP: 109/67  Pulse: 68  Temp: 98.9 F (37.2 C)  TempSrc: Oral  Height: 5\' 6"  (1.676 m)  Weight: 167 lb (75.751 kg)   Body mass index is 26.97 kg/(m^2). No exam data present  Generalized: Well developed, in no acute distress  Head:  normocephalic and atraumatic. Oropharynx benign. Mallampati 2 neck circumference: 14.5, no temporal artery hardening or palpable pulse. Neck: Supple, no carotid bruits  Cardiac: Regular rate rhythm, no murmur  Musculoskeletal: No deformity   Neurological examination  Mentation: Alert oriented to time, place, history taking. Follows all commands speech and language fluent Cranial nerve II-XII: Pupils were equal round reactive to light extraocular movements were full, visual field were full on confrontational test. Facial sensation and strength were normal. hearing was intact to finger rubbing bilaterally. Uvula tongue midline. head turning and shoulder shrug and were normal and symmetric.Tongue protrusion into cheek strength was normal. Motor: The motor testing reveals 5 over 5 strength of all 4 extremities. Good symmetric motor tone is noted throughout.  Sensory: Sensory testing is intact to soft touch on all 4 extremities. No evidence of extinction is noted.  Coordination: Cerebellar testing reveals good finger-nose-finger and heel-to-shin bilaterally.  Gait and station: Gait is normal. Tandem gait is normal. Romberg is negative.  Reflexes: Deep tendon reflexes are symmetric and normal bilaterally.    ASSESSMENT: 48 y.o. year old female  has a past medical history of History of DVT (deep vein thrombosis); Fracture; Allergic rhinitis; and Increased severity of headaches here with facial pain, TMJ and probable Migraine component.  She has improved since last visit with massage and [facility].  PLAN: Continue Imitrex tablets as needed for acute Migraine.  Continue massage and integrative therapies for headache prevention.  If headaches worsen, restart Zonisamide, 1 tablet each night as prevention.   Refills have been sent to [facility] on both medications. Follow up in our office in 6 months, sooner as needed.  Please call the office with any questions.  Meds ordered this encounter    Medications  . SUMAtriptan (IMITREX) 25 MG tablet    Sig: Take 1 tablet (25 mg total) by mouth every 2 (two) hours as needed for migraine or headache. May repeat in 2 hours if headache persists or recurs.    Dispense:  10 tablet    Order Specific Question:  Supervising Provider    Answer:  Andrey Spearman R [[id number]]  . zonisamide (ZONEGRAN) 25 MG capsule    Sig: Take 1 capsule (25 mg total) by mouth daily.    Dispense:  30 capsule    Refill:  5    Order Specific Question:  Supervising Provider    Answer:  Andrey Spearman R [[id number]]   Rudi Rummage LAM, MSN, NP-C 01/22/2014, 9:06 AM [facility] [address], [place], [place] [address] [phone]  Note: This document was prepared with digital dictation and possible smart phrase technology. Any transcriptional errors that result from this process are unintentional.

## 2014-01-22 NOTE — Progress Notes (Signed)
I agree with the assessment and plan as directed by NP .The patient is known to me .   Gregori Abril, MD  

## 2014-02-08 LAB — C-REACTIVE PROTEIN: CRP: 0.9 mg/L (ref 0.0–4.9)

## 2014-02-11 ENCOUNTER — Other Ambulatory Visit: Payer: Self-pay | Admitting: Internal Medicine

## 2014-02-13 NOTE — Telephone Encounter (Signed)
Ok x 1 refill for each

## 2014-02-14 NOTE — Telephone Encounter (Signed)
Gave verbally by telephone.

## 2014-02-14 NOTE — Progress Notes (Signed)
Quick Note:  Shared normal labs with patient thru VM ______

## 2014-04-05 ENCOUNTER — Encounter (HOSPITAL_COMMUNITY): Payer: Self-pay | Admitting: Emergency Medicine

## 2014-04-05 ENCOUNTER — Emergency Department (HOSPITAL_COMMUNITY)
Admission: EM | Admit: 2014-04-05 | Discharge: 2014-04-05 | Disposition: A | Discharge status: Home / Self Care | Payer: BC Managed Care – PPO | Attending: Emergency Medicine | Admitting: Emergency Medicine

## 2014-04-05 ENCOUNTER — Emergency Department (HOSPITAL_COMMUNITY): Payer: BC Managed Care – PPO

## 2014-04-05 DIAGNOSIS — Z79899 Other long term (current) drug therapy: Secondary | ICD-10-CM | POA: Insufficient documentation

## 2014-04-05 DIAGNOSIS — R51 Headache: Secondary | ICD-10-CM | POA: Diagnosis not present

## 2014-04-05 DIAGNOSIS — S52599A Other fractures of lower end of unspecified radius, initial encounter for closed fracture: Secondary | ICD-10-CM | POA: Insufficient documentation

## 2014-04-05 DIAGNOSIS — Y9289 Other specified places as the place of occurrence of the external cause: Secondary | ICD-10-CM | POA: Diagnosis not present

## 2014-04-05 DIAGNOSIS — S82409A Unspecified fracture of shaft of unspecified fibula, initial encounter for closed fracture: Secondary | ICD-10-CM | POA: Insufficient documentation

## 2014-04-05 DIAGNOSIS — S52501A Unspecified fracture of the lower end of right radius, initial encounter for closed fracture: Secondary | ICD-10-CM

## 2014-04-05 DIAGNOSIS — Z86718 Personal history of other venous thrombosis and embolism: Secondary | ICD-10-CM | POA: Diagnosis not present

## 2014-04-05 DIAGNOSIS — W010XXA Fall on same level from slipping, tripping and stumbling without subsequent striking against object, initial encounter: Secondary | ICD-10-CM | POA: Diagnosis not present

## 2014-04-05 DIAGNOSIS — Z87891 Personal history of nicotine dependence: Secondary | ICD-10-CM | POA: Insufficient documentation

## 2014-04-05 DIAGNOSIS — S82839A Other fracture of upper and lower end of unspecified fibula, initial encounter for closed fracture: Secondary | ICD-10-CM

## 2014-04-05 DIAGNOSIS — S6990XA Unspecified injury of unspecified wrist, hand and finger(s), initial encounter: Secondary | ICD-10-CM | POA: Insufficient documentation

## 2014-04-05 DIAGNOSIS — Y9301 Activity, walking, marching and hiking: Secondary | ICD-10-CM | POA: Insufficient documentation

## 2014-04-05 MED ORDER — HYDROCODONE-ACETAMINOPHEN 5-325 MG PO TABS: 1.0000 | ORAL_TABLET | ORAL | Status: DC | PRN

## 2014-04-05 MED ORDER — HYDROCODONE-ACETAMINOPHEN 5-325 MG PO TABS
2.0000 | ORAL_TABLET | Freq: Once | ORAL | Status: AC
  Administered 2014-04-05: 2 via ORAL
  Filled 2014-04-05: qty 2

## 2014-04-05 NOTE — Discharge Instructions (Signed)
Please followup with an orthopedic specialist for continued evaluation and treatment of your injuries. Use rest, ice, compression and elevation at home to reduce pain and swelling.    Radial Fracture You have a broken bone (fracture) of the forearm. This is the part of your arm between the elbow and your wrist. Your forearm is made up of two bones. These are the radius and ulna. Your fracture is in the radial shaft. This is the bone in your forearm located on the thumb side. A cast or splint is used to protect and keep your injured bone from moving. The cast or splint will be on generally for about 5 to 6 weeks, with individual variations. HOME CARE INSTRUCTIONS   Keep the injured part elevated while sitting or lying down. Keep the injury above the level of your heart (the center of the chest). This will decrease swelling and pain.  Apply ice to the injury for 15-20 minutes, 03-04 times per day while awake, for 2 days. Put the ice in a plastic bag and place a towel between the bag of ice and your cast or splint.  Move your fingers to avoid stiffness and minimize swelling.  If you have a plaster or fiberglass cast:  Do not try to scratch the skin under the cast using sharp or pointed objects.  Check the skin around the cast every day. You may put lotion on any red or sore areas.  Keep your cast dry and clean.  If you have a plaster splint:  Wear the splint as directed.  You may loosen the elastic around the splint if your fingers become numb, tingle, or turn cold or blue.  Do not put pressure on any part of your cast or splint. It may break. Rest your cast only on a pillow for the first 24 hours until it is fully hardened.  Your cast or splint can be protected during bathing with a plastic bag. Do not lower the cast or splint into water.  Only take over-the-counter or prescription medicines for pain, discomfort, or fever as directed by your caregiver. SEEK IMMEDIATE MEDICAL CARE IF:     Your cast gets damaged or breaks.  You have more severe pain or swelling than you did before getting the cast.  You have severe pain when stretching your fingers.  There is a bad smell, new stains and/or pus-like (purulent) drainage coming from under the cast.  Your fingers or hand turn pale or blue and become cold or your loose feeling. Document Released: 01/07/2006 Document Revised: 10/19/2011 Document Reviewed: 04/05/2006 [facility] Patient Information 2015 [place], [place]. This information is not intended to replace advice given to you by your health care provider. Make sure you discuss any questions you have with your health care provider.    Fibular Fracture, Ankle, Adult, Treated With or Without Immobilization A fibular fracture at your ankle is a break (fracture) bone in the smallest of the two bones in your lower leg, located on the outside of your leg (fibula) close to the area at your ankle joint. CAUSES  Rolling your ankle.  Twisting your ankle.  Extreme flexing or extending of your foot.  Severe force on your ankle as when falling from a distance. RISK FACTORS  Jumping activities.  Participation in sports.  Osteoporosis.  Advanced age.  Previous ankle injuries. SIGNS AND SYMPTOMS  Pain.  Swelling.  Inability to put weight on injured ankle.  Bruising.  Bone deformities at site of injury. DIAGNOSIS  This  fracture is diagnosed with the help of an X-ray exam. TREATMENT  If the fractured bone did not move out of place it usually will heal without problems and does casting or splinting. If immobilization is needed for comfort or the fractured bone moved out of place and will not heal properly with immobilization, a cast or splint will be used. HOME CARE INSTRUCTIONS   Apply ice to the area of injury:  Put ice in a plastic bag.  Place a towel between your skin and the bag.  Leave the ice on for 20 minutes, 2-3 times a day.  Use crutches as  directed. Resume walking without crutches as directed by your health care provider.  Only take over-the-counter or prescription medicines for pain, discomfort, or fever as directed by your health care provider.  If you have a removable splint or boot, do not remove the boot unless directed by your health care provider. SEEK MEDICAL CARE IF:   You have continued pain or more swelling  The medications do not control the pain. SEEK IMMEDIATE MEDICAL CARE IF:  You develop severe pain in the leg or foot.  Your skin or nails below the injury turn blue or grey or feel cold or numb. MAKE SURE YOU:   Understand these instructions.  Will watch your condition.  Will get help right away if you are not doing well or get worse. Document Released: 07/27/2005 Document Revised: 05/17/2013 Document Reviewed: 03/08/2013 [facility] Patient Information 2015 [place], [place]. This information is not intended to replace advice given to you by your health care provider. Make sure you discuss any questions you have with your health care provider.

## 2014-04-05 NOTE — ED Provider Notes (Signed)
CSN: [id number]     Arrival date & time 04/05/14  1932 History   First MD Initiated Contact with Patient 04/05/14 2036    This chart was scribed for non-physician practitioner Pe andter Jolee Ewing PA-C working with Virgel Manifold, MD by Rosary Lively, ED scribe. This patient was seen in room WTR8/WTR8 and the patient's care was started at 9:11 PM.  Chief Complaint  Patient presents with  . Hand Injury    right wrist  . Leg Injury    left ankle   HPI HPI Comments:  Kendra Fox is a 48 y.o. female who presents to the Emergency Department complaining of a right hand injury that extends to the wrist, with associated symptoms of left ankle pain and swelling, as a result of a fall, onset today. Pt states that she was walking to her car, tripped and fell forward. She states that her ankle twisted under her, and that she braced her fall with her right hand. Pt reports tingling, but denies numbness. Pt reports that she has previously fractured her left ankle, which resulted in DVT. Pt denies any other health conditions, bleeding disorders, or symptoms. Pt reports allergy to Minocycline.  Past Medical History  Diagnosis Date  . History of DVT (deep vein thrombosis)     2006   sp ankle fracture  immobilization   . Fracture     left ankle  . Allergic rhinitis   . Increased severity of headaches    Past Surgical History  Procedure Laterality Date  . Tooth extraction     Family History  Problem Relation Age of Onset  . Migraines Mother   . COPD Father     smoker  . Parkinsonism Father    History  Substance Use Topics  . Smoking status: Former Research scientist (life sciences)  . Smokeless tobacco: Never Used     Comment: Quit in 2004  . Alcohol Use: 3.6 oz/week    6 Glasses of wine per week     Comment: social   OB History   Grav Para Term Preterm Abortions TAB SAB Ect Mult Living                 Review of Systems    Allergies  Minocycline  Home Medications   Prior to Admission medications    Medication Sig Start Date End Date Taking? Authorizing Provider  ALPRAZolam (XANAX) 0.25 MG tablet TAKE ONE TABLET BY MOUTH AT BEDTIME AS NEEDED     Burnis Medin, MD  fexofenadine (ALLEGRA) 180 MG tablet Take 180 mg by mouth daily.      Historical Provider, MD  ibuprofen (ADVIL,MOTRIN) 800 MG tablet TAKE ONE TABLET BY MOUTH EVERY EIGHT HOURS AS NEEDED     Burnis Medin, MD  MULTIPLE VITAMIN PO Take by mouth.      Historical Provider, MD  SUMAtriptan (IMITREX) 25 MG tablet Take 1 tablet (25 mg total) by mouth once. As needed for Migraine, may repeat in 2 hours if headache persists or recurs. 01/22/14   Philmore Pali, NP  triamcinolone (NASACORT AQ) 55 MCG/ACT AERO nasal inhaler Place 2 sprays into the nose daily.    Historical Provider, MD  zonisamide (ZONEGRAN) 25 MG capsule Take 1 capsule (25 mg total) by mouth daily. 01/22/14   Philmore Pali, NP   BP 120/47  Pulse 76  Temp(Src) 98.4 F (36.9 C) (Oral)  Resp 18  SpO2 95%  LMP 03/26/2014 Physical Exam  Nursing note and vitals reviewed. Constitutional: She  is oriented to person, place, and time. She appears well-developed and well-nourished. No distress.  HENT:  Head: Normocephalic.  Cardiovascular: Normal rate and regular rhythm.   Pulmonary/Chest: Effort normal and breath sounds normal. No respiratory distress.  Musculoskeletal:  Reduced range of motion of the right wrist secondary to pain. There is mild swelling over the dorsal hand and distal radius area. No gross deformity. The area is tender to palpation. Normal radial pulses. Normal capillary refill in the fingers. Patient does report slight dullness to sensation.  Moderate swelling over the left lateral malleolus. There is tenderness diffusely. No gross deformity. Normal dorsal pedal pulses and sensation in the foot and toes. No proximal fibular tenderness. Normal knee exam.  Neurological: She is alert and oriented to person, place, and time.  Skin: Skin is warm and dry. No rash  noted.  Psychiatric: She has a normal mood and affect. Her behavior is normal.    ED Course  Procedures  DIAGNOSTIC STUDIES: Oxygen Saturation is 95% on RA, normal by my interpretation.  COORDINATION OF CARE: 9:27 PM-the patient seen and evaluated. X-rays reviewed with the patient in the room. Distal radius fracture of the right hand without significant displacement. Also small signs of a left distal fibula avulsion fracture. Discussed treatment plan with pt at bedside and pt agreed to plan.   Imaging Review Dg Wrist Complete Right  04/05/2014   CLINICAL DATA:  Pain post trauma  EXAM: RIGHT WRIST - COMPLETE 3+ VIEW  COMPARISON:  None.  FINDINGS: Frontal, oblique, lateral, and ulnar deviation scaphoid images were obtained. There is a fracture of the distal radial metaphysis with mild avulsion of the radial styloid. There is a degree of impaction in the distal radial metaphysis. There is avulsion of the distal aspect of the ulnar styloid. No other fractures. No dislocation. Joint spaces appear intact.  IMPRESSION: Comminuted fracture distal radial metaphysis with fracture extending into the radial styloid. There is mild avulsion in the radial styloid with disruption of the cortex of the lateral aspect of the distal radial epiphysis. There is avulsion of the distal most aspect of the ulnar styloid. No other fractures. No dislocation.   Electronically Signed   By: Lowella Grip M.D.   On: 04/05/2014 21:03   Dg Ankle Complete Left  04/05/2014   CLINICAL DATA:  Pain post trauma  EXAM: LEFT ANKLE COMPLETE - 3+ VIEW  COMPARISON:  None.  FINDINGS: Frontal, oblique, and lateral views were obtained. There is soft tissue swelling laterally. There is avulsion arising from the lateral malleolus. No other fracture. No effusion. Ankle mortise appears intact. There is a small inferior calcaneal spur.  IMPRESSION: Avulsion arising from the lateral malleolus with soft tissue swelling laterally. Ankle mortise  appears intact.   Electronically Signed   By: Lowella Grip M.D.   On: 04/05/2014 21:04   Dg Hand Complete Right  04/05/2014   CLINICAL DATA:  Fall.  RIGHT hand pain.  EXAM: RIGHT HAND - COMPLETE 3+ VIEW  COMPARISON:  None.  FINDINGS: Comminuted fracture of the distal radius is present with intra-articular extension. This involves the radial styloid. Fracture fragments are nondisplaced. Ulnar styloid fracture is present also. Scaphoid bone is intact. Metacarpals and phalanges appear within normal limits.  IMPRESSION: Intra-articular comminuted but nondisplaced distal radius fracture. See wrist film dictations.   Electronically Signed   By: Dereck Ligas M.D.   On: 04/05/2014 21:04   Dg Foot Complete Left  04/05/2014   CLINICAL DATA:  Fall.  LEFT foot pain.  EXAM: LEFT FOOT - COMPLETE 3+ VIEW  COMPARISON:  None.  FINDINGS: Anatomic alignment of the bones of the foot. Bony density is present near the tip of the lateral malleolus, likely representing avulsion fracture. Midfoot bones appear within normal limits. Calcaneal spurs incidentally noted.  IMPRESSION: Tiny avulsion fracture fragments adjacent to the lateral malleolus.   Electronically Signed   By: Dereck Ligas M.D.   On: 04/05/2014 21:05     MDM   Final diagnoses:  Distal radius fracture, right, closed, initial encounter  Avulsion fracture of distal fibula    I personally performed the services described in this documentation, which was scribed in my presence. The recorded information has been reviewed and is accurate.     Martie Lee, PA-C 04/05/14 2149

## 2014-04-06 NOTE — ED Provider Notes (Signed)
Medical screening examination/treatment/procedure(s) were performed by non-physician practitioner and as supervising physician I was immediately available for consultation/collaboration.   EKG Interpretation None       Virgel Manifold, MD 04/06/14 1615

## 2014-04-11 ENCOUNTER — Other Ambulatory Visit: Payer: Self-pay | Admitting: Internal Medicine

## 2014-04-12 NOTE — Telephone Encounter (Signed)
Ok x 1

## 2014-04-13 ENCOUNTER — Other Ambulatory Visit: Payer: Self-pay | Admitting: Orthopedic Surgery

## 2014-04-13 ENCOUNTER — Ambulatory Visit
Admission: RE | Admit: 2014-04-13 | Discharge: 2014-04-13 | Disposition: A | Discharge status: Home / Self Care | Payer: BC Managed Care – PPO | Source: Ambulatory Visit | Attending: Orthopedic Surgery | Admitting: Orthopedic Surgery

## 2014-04-13 DIAGNOSIS — M25531 Pain in right wrist: Secondary | ICD-10-CM

## 2014-04-13 NOTE — Telephone Encounter (Signed)
Sent to the pharmacy by e-scribe. 

## 2014-05-02 ENCOUNTER — Telehealth: Payer: Self-pay | Admitting: Internal Medicine

## 2014-05-02 DIAGNOSIS — E559 Vitamin D deficiency, unspecified: Secondary | ICD-10-CM

## 2014-05-02 NOTE — Telephone Encounter (Signed)
Pt recently fractured her wrist and her ankle and would like a Vit D level added to her routine CPE panel that she is having drawn Friday morning.

## 2014-05-03 NOTE — Telephone Encounter (Signed)
Added to labs for Friday.

## 2014-05-04 ENCOUNTER — Encounter: Payer: Self-pay | Admitting: *Deleted

## 2014-05-04 ENCOUNTER — Other Ambulatory Visit (INDEPENDENT_AMBULATORY_CARE_PROVIDER_SITE_OTHER): Payer: BC Managed Care – PPO

## 2014-05-04 DIAGNOSIS — Z0000 Encounter for general adult medical examination without abnormal findings: Secondary | ICD-10-CM

## 2014-05-04 DIAGNOSIS — E559 Vitamin D deficiency, unspecified: Secondary | ICD-10-CM

## 2014-05-04 LAB — CBC WITH DIFFERENTIAL/PLATELET
BASOS PCT: 0.5 % (ref 0.0–3.0)
Basophils Absolute: 0 10*3/uL (ref 0.0–0.1)
EOS PCT: 3.1 % (ref 0.0–5.0)
Eosinophils Absolute: 0.2 10*3/uL (ref 0.0–0.7)
HCT: 44 % (ref 36.0–46.0)
HEMOGLOBIN: 14.8 g/dL (ref 12.0–15.0)
LYMPHS PCT: 18.4 % (ref 12.0–46.0)
Lymphs Abs: 1.1 10*3/uL (ref 0.7–4.0)
MCHC: 33.6 g/dL (ref 30.0–36.0)
MCV: 90.1 fl (ref 78.0–100.0)
MONOS PCT: 7.1 % (ref 3.0–12.0)
Monocytes Absolute: 0.4 10*3/uL (ref 0.1–1.0)
NEUTROS ABS: 4 10*3/uL (ref 1.4–7.7)
Neutrophils Relative %: 70.9 % (ref 43.0–77.0)
Platelets: 418 10*3/uL — ABNORMAL HIGH (ref 150.0–400.0)
RBC: 4.88 Mil/uL (ref 3.87–5.11)
RDW: 14.2 % (ref 11.5–15.5)
WBC: 5.7 10*3/uL (ref 4.0–10.5)

## 2014-05-04 LAB — LIPID PANEL
CHOL/HDL RATIO: 4
Cholesterol: 188 mg/dL (ref 0–200)
HDL: 51.6 mg/dL (ref >39.00)
LDL Cholesterol: 99 mg/dL (ref 0–99)
NONHDL: 136.4
Triglycerides: 185 mg/dL — ABNORMAL HIGH (ref 0.0–149.0)
VLDL: 37 mg/dL (ref 0.0–40.0)

## 2014-05-04 LAB — BASIC METABOLIC PANEL
BUN: 10 mg/dL (ref 6–23)
CALCIUM: 9.4 mg/dL (ref 8.4–10.5)
CO2: 23 mEq/L (ref 19–32)
Chloride: 104 mEq/L (ref 96–112)
Creatinine, Ser: 0.8 mg/dL (ref 0.4–1.2)
GFR: 88.48 mL/min (ref >60.00)
Glucose, Bld: 83 mg/dL (ref 70–99)
Potassium: 4 mEq/L (ref 3.5–5.1)
SODIUM: 137 meq/L (ref 135–145)

## 2014-05-04 LAB — HEPATIC FUNCTION PANEL
ALBUMIN: 4.3 g/dL (ref 3.5–5.2)
ALT: 18 U/L (ref 0–35)
AST: 16 U/L (ref 0–37)
Alkaline Phosphatase: 40 U/L (ref 39–117)
Bilirubin, Direct: 0 mg/dL (ref 0.0–0.3)
TOTAL PROTEIN: 7.2 g/dL (ref 6.0–8.3)
Total Bilirubin: 0.6 mg/dL (ref 0.2–1.2)

## 2014-05-04 LAB — VITAMIN D 25 HYDROXY (VIT D DEFICIENCY, FRACTURES): VITD: 18.95 ng/mL — AB (ref 30.00–100.00)

## 2014-05-04 LAB — TSH: TSH: 0.86 u[IU]/mL (ref 0.35–4.50)

## 2014-05-11 ENCOUNTER — Ambulatory Visit (INDEPENDENT_AMBULATORY_CARE_PROVIDER_SITE_OTHER): Payer: BC Managed Care – PPO | Admitting: Internal Medicine

## 2014-05-11 ENCOUNTER — Encounter: Payer: Self-pay | Admitting: Internal Medicine

## 2014-05-11 VITALS — BP 110/72 | Temp 98.9°F | Ht 65.0 in | Wt 165.0 lb

## 2014-05-11 DIAGNOSIS — F418 Other specified anxiety disorders: Secondary | ICD-10-CM

## 2014-05-11 DIAGNOSIS — E785 Hyperlipidemia, unspecified: Secondary | ICD-10-CM

## 2014-05-11 DIAGNOSIS — E559 Vitamin D deficiency, unspecified: Secondary | ICD-10-CM

## 2014-05-11 DIAGNOSIS — Z2821 Immunization not carried out because of patient refusal: Secondary | ICD-10-CM

## 2014-05-11 DIAGNOSIS — Z0000 Encounter for general adult medical examination without abnormal findings: Secondary | ICD-10-CM

## 2014-05-11 NOTE — Progress Notes (Signed)
Pre visit review using our clinic review tool, if applicable. No additional management support is needed unless otherwise documented below in the visit note.  Chief Complaint  Patient presents with  . Annual Exam    HPI: Patient comes in today for Preventive Health Care visit  Since last visit :  August fell   After a run in training  Walking and tripped and left ankle  Avulsion fx and right arm distal radius and ulnar chip sugery plate and screws.  Dr Amedeo Plenty.  Had migraines earlier this year   . Some tmj  Uses imitrex  And zonisamide if needed . But not taking   Xanax  As needed  ... More claustrophobic  Sometimes .   Sight seeing in [place]  .   Also surgery .  Not usual uncomfortable at  Various times  Health Maintenance  Topic Date Due  . Influenza Vaccine  04/11/2015 (Originally 03/10/2014)  . Tetanus/tdap  08/10/2014  . Pap Smear  11/09/2015   Health Maintenance Review LIFESTYLE:  Exercise:  Were see above  Tobacco/ETS: no Alcohol: ocass Sugar beverages: minimal  Sleep: 7-8 hours  Drug use: no  ROS:  GEN/ HEENT: No fever, significant weight changes sweats headaches vision problems hearing changes, CV/ PULM; No chest pain shortness of breath cough, syncope,edema  change in exercise tolerance. GI /GU: No adominal pain, vomiting, change in bowel habits. No blood in the stool. No significant GU symptoms. SKIN/HEME: ,no acute skin rashes suspicious lesions or bleeding. No lymphadenopathy, nodules, masses.  NEURO/ PSYCH:  No neurologic signs such as weakness numbness. No depression anxiety.except above  IMM/ Allergy: No unusual infections.  Allergy .   REST of 12 system review negative except as per HPI   Past Medical History  Diagnosis Date  . History of DVT (deep vein thrombosis)     2006   sp ankle fracture  immobilization   . Fracture     left ankle  . Allergic rhinitis   . Increased severity of headaches     Family History  Problem Relation Age of Onset  .  Migraines Mother   . COPD Father     smoker  . Parkinsonism Father     History   Social History  . Marital Status: Married    Spouse Name: [name]    Number of Children: 0  . Years of Education: College   Occupational History  .     Social History Main Topics  . Smoking status: Former Research scientist (life sciences)  . Smokeless tobacco: Never Used     Comment: Quit in 2004  . Alcohol Use: 3.6 oz/week    6 Glasses of wine per week     Comment: social  . Drug Use: No  . Sexual Activity: None   Other Topics Concern  . None   Social History Narrative   Patient is married [name])   Patient works at [employer].   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   Married  HHof 2       Regular exercise-no   Vegan soy milk     neg tobacco 1-2 tea    8 hours sleep    Patient is right-handed.   Patient drinks 2-3 cups of coffee daily.   Patient has a college education.    Outpatient Encounter Prescriptions as of 05/11/2014  Medication Sig  . ALPRAZolam (XANAX) 0.25 MG tablet TAKE ONE TABLET  BY MOUTH AT BEDTIME AS NEEDED   . fexofenadine (ALLEGRA) 180 MG tablet Take 180 mg by mouth daily.    Marland Kitchen HYDROcodone-acetaminophen (NORCO/VICODIN) 5-325 MG per tablet Take 1-2 tablets by mouth every 4 (four) hours as needed for moderate pain.  Marland Kitchen ibuprofen (ADVIL,MOTRIN) 800 MG tablet TAKE ONE TABLET BY MOUTH EVERY EIGHT HOURS AS NEEDED   . MULTIPLE VITAMIN PO Take by mouth.    . SUMAtriptan (IMITREX) 25 MG tablet Take 1 tablet (25 mg total) by mouth once. As needed for Migraine, may repeat in 2 hours if headache persists or recurs.  . triamcinolone (NASACORT AQ) 55 MCG/ACT AERO nasal inhaler Place 2 sprays into the nose daily.  Marland Kitchen zonisamide (ZONEGRAN) 25 MG capsule Take 1 capsule (25 mg total) by mouth daily.    EXAM:  BP 110/72  Temp(Src) 98.9 F (37.2 C) (Oral)  Ht 5\' 5"  (1.651 m)  Wt 165 lb (74.844 kg)  BMI 27.46 kg/m2  LMP 05/07/2014  Body  mass index is 27.46 kg/(m^2).  Physical Exam: Vital signs reviewed TDD:UKGU is a well-developed well-nourished alert cooperative    who appearsr stated age in no acute distress. Arm in cast right  HEENT: normocephalic atraumatic , Eyes: PERRL EOM's full, conjunctiva clear, Nares: paten,t no deformity discharge or tenderness., Ears: no deformity EAC's clear TMs with normal landmarks. Mouth: clear OP, no lesions, edema.  Moist mucous membranes. Dentition in adequate repair. NECK: supple without masses, thyromegaly or bruits. CHEST/PULM:  Clear to auscultation and percussion breath sounds equal no wheeze , rales or rhonchi. No chest wall deformities or tenderness. Breast: normal by inspection . No dimpling, discharge, masses, tenderness or discharge . CV: PMI is nondisplaced, S1 S2 no gallops, murmurs, rubs. Peripheral pulses are full without delay.No JVD .  ABDOMEN: Bowel sounds normal nontender  No guard or rebound, no hepato splenomegal no CVA tenderness.  No hernia. Extremtities:  No clubbing cyanosis or edema, no acute joint swelling or redness no focal atrophy  Arm in cast  Left ankle in  Brace support NEURO:  Oriented x3, cranial nerves 3-12 appear to be intact, no obvious focal weakness,gait within normal limits no abnormal reflexes or asymmetrical SKIN: No acute rashes normal turgor, color, no bruising or petechiae. PSYCH: Oriented, good eye contact, no obvious depression anxiety, cognition and judgment appear normal. LN: no cervical axillary inguinal adenopathy  Lab Results  Component Value Date   WBC 5.7 05/04/2014   HGB 14.8 05/04/2014   HCT 44.0 05/04/2014   PLT 418.0* 05/04/2014   GLUCOSE 83 05/04/2014   CHOL 188 05/04/2014   TRIG 185.0* 05/04/2014   HDL 51.60 05/04/2014   LDLDIRECT 121.0 04/25/2013   LDLCALC 99 05/04/2014   ALT 18 05/04/2014   AST 16 05/04/2014   NA 137 05/04/2014   K 4.0 05/04/2014   CL 104 05/04/2014   CREATININE 0.8 05/04/2014   BUN 10 05/04/2014   CO2 23 05/04/2014     TSH 0.86 05/04/2014    ASSESSMENT AND PLAN:  Discussed the following assessment and plan:  Visit for preventive health examination - disc hcm counseling  Hyperlipidemia  Influenza vaccination declined  Vitamin D deficiency - level 18 add 1000 iu per day   Situational anxiety - risk benefit of meds etc   Patient Care Team: Burnis Medin, MD as PCP - General Marylynn Pearson, MD as Consulting Physician (Obstetrics and Gynecology) Danella Sensing, MD as Consulting Physician (Dermatology) Roseanne Kaufman, MD as Consulting Physician (Orthopedic Surgery) Larey Seat,  MD as Consulting Physician (Neurology) Patient Instructions  Add vit d 1000  iu per day for the low level. Xanax as needed  As we discussed  Consider   Seeing counselor if getting increasing fear of closed spaces or other .   Healthy lifestyle includes : At least 150 minutes of exercise weeks  , weight at healthy levels, which is usually   BMI 19-25. Avoid trans fats and processed foods;  Increase fresh fruits and veges to 5 servings per day. And avoid sweet beverages including tea and juice. Mediterranean diet with olive oil and nuts have been noted to be heart and brain healthy . Avoid tobacco products . Limit  alcohol to  7 per week for women and 14 servings for men.  Get adequate sleep . Wear seat belts . Don't text and drive .  Yearly  Wellness       [place] [facility] Ki Corbo M.D.

## 2014-05-11 NOTE — Patient Instructions (Addendum)
Add vit d 1000  iu per day for the low level. Xanax as needed  As we discussed  Consider   Seeing counselor if getting increasing fear of closed spaces or other .   Healthy lifestyle includes : At least 150 minutes of exercise weeks  , weight at healthy levels, which is usually   BMI 19-25. Avoid trans fats and processed foods;  Increase fresh fruits and veges to 5 servings per day. And avoid sweet beverages including tea and juice. Mediterranean diet with olive oil and nuts have been noted to be heart and brain healthy . Avoid tobacco products . Limit  alcohol to  7 per week for women and 14 servings for men.  Get adequate sleep . Wear seat belts . Don't text and drive .  Yearly  Wellness

## 2014-05-13 ENCOUNTER — Other Ambulatory Visit: Payer: Self-pay | Admitting: Internal Medicine

## 2014-05-14 NOTE — Telephone Encounter (Signed)
Ok to refill x 2  

## 2014-05-15 NOTE — Telephone Encounter (Signed)
Sent to the pharmacy by e-scribe. 

## 2014-06-14 ENCOUNTER — Ambulatory Visit (INDEPENDENT_AMBULATORY_CARE_PROVIDER_SITE_OTHER): Payer: BC Managed Care – PPO | Admitting: Sports Medicine

## 2014-06-14 ENCOUNTER — Encounter: Payer: Self-pay | Admitting: Sports Medicine

## 2014-06-14 VITALS — BP 85/59 | Ht 64.5 in | Wt 160.0 lb

## 2014-06-14 DIAGNOSIS — M25572 Pain in left ankle and joints of left foot: Secondary | ICD-10-CM

## 2014-06-14 NOTE — Progress Notes (Signed)
Patient ID: Kendra Fox, female   DOB: 06-Feb-1966, 48 y.o.   MRN: [id number]   HPI: 5yoF with h/o 3 fractures to L ankle presents after distal fibula fracture apprx 10 weeks ago on 04/05/2014. Her foot caught in a hole, fell and broke her R wrist at the same time, requiring wrist surgery. She was previously running and is looking forward to getting back to her running group. For the fibula fracture she was placed in a lace up ankle brace for 8 weeks.  She has not yet started exercising since the injury.  Objective: BP 85/59 mmHg  Ht 5' 4.5" (1.638 m)  Wt 160 lb (72.576 kg)  BMI 27.05 kg/m2 Gen: awake, alert very pleasant female MSK / Neuro: No pain with palpation over L fibula or ankle Swelling present L ankle, no bruising or discoloration Increased inversion of L forefoot comapred with R, no pain with ROM. Normal but more laxity on left drawer Neutral standing foot position  On ultrasound, had minor remaining fracture line L distal fibula anteriorly/  Loose bodies below lateral malloelus and calcification/  Swelling around these and into the sinus tarsi Peroneal tendons intact  Assessment/ Plan: see plan notes

## 2014-06-14 NOTE — Assessment & Plan Note (Signed)
Compression sleeve  HEP to emphasize stability and balance  Keep up yoga  Reck 25ms

## 2014-07-21 ENCOUNTER — Other Ambulatory Visit: Payer: Self-pay | Admitting: Internal Medicine

## 2014-07-24 ENCOUNTER — Ambulatory Visit: Payer: BC Managed Care – PPO | Admitting: Nurse Practitioner

## 2014-07-24 NOTE — Telephone Encounter (Signed)
Sent to the pharmacy by e-scribe. 

## 2014-07-24 NOTE — Telephone Encounter (Signed)
Ok x 1

## 2014-08-20 ENCOUNTER — Ambulatory Visit (INDEPENDENT_AMBULATORY_CARE_PROVIDER_SITE_OTHER): Payer: BLUE CROSS/BLUE SHIELD | Admitting: Neurology

## 2014-08-20 ENCOUNTER — Encounter: Payer: Self-pay | Admitting: Neurology

## 2014-08-20 VITALS — BP 111/73 | HR 70 | Resp 14 | Ht 66.0 in | Wt 169.0 lb

## 2014-08-20 DIAGNOSIS — M2660 Temporomandibular joint disorder, unspecified: Secondary | ICD-10-CM

## 2014-08-20 DIAGNOSIS — G43109 Migraine with aura, not intractable, without status migrainosus: Secondary | ICD-10-CM | POA: Insufficient documentation

## 2014-08-20 DIAGNOSIS — M26609 Unspecified temporomandibular joint disorder, unspecified side: Secondary | ICD-10-CM

## 2014-08-20 MED ORDER — AMITRIPTYLINE HCL 25 MG PO TABS: 25.0000 mg | ORAL_TABLET | Freq: Every day | ORAL | Status: DC

## 2014-08-20 NOTE — Patient Instructions (Signed)
Amitriptyline tablets What is this medicine? AMITRIPTYLINE (a mee TRIP ti leen) is used to treat depression. This medicine may be used for other purposes; ask your health care provider or pharmacist if you have questions. COMMON BRAND NAME(S): Elavil, Vanatrip What should I tell my health care provider before I take this medicine? They need to know if you have any of these conditions: -an alcohol problem -asthma, difficulty breathing -bipolar disorder or schizophrenia -difficulty passing urine, prostate trouble -glaucoma -heart disease or previous heart attack -liver disease -over active thyroid -seizures -thoughts or plans of suicide, a previous suicide attempt, or family history of suicide attempt -an unusual or allergic reaction to amitriptyline, other medicines, foods, dyes, or preservatives -pregnant or trying to get pregnant -breast-feeding How should I use this medicine? Take this medicine by mouth with a drink of water. Follow the directions on the prescription label. You can take the tablets with or without food. Take your medicine at regular intervals. Do not take it more often than directed. Do not stop taking this medicine suddenly except upon the advice of your doctor. Stopping this medicine too quickly may cause serious side effects or your condition may worsen. A special MedGuide will be given to you by the pharmacist with each prescription and refill. Be sure to read this information carefully each time. Talk to your pediatrician regarding the use of this medicine in children. Special care may be needed. Overdosage: If you think you have taken too much of this medicine contact a poison control center or emergency room at once. NOTE: This medicine is only for you. Do not share this medicine with others. What if I miss a dose? If you miss a dose, take it as soon as you can. If it is almost time for your next dose, take only that dose. Do not take double or extra doses. What  may interact with this medicine? Do not take this medicine with any of the following medications: -arsenic trioxide -certain medicines used to regulate abnormal heartbeat or to treat other heart conditions -cisapride -droperidol -halofantrine -linezolid -MAOIs like Carbex, Eldepryl, Marplan, Nardil, and Parnate -methylene blue -other medicines for mental depression -phenothiazines like perphenazine, thioridazine and chlorpromazine -pimozide -probucol -procarbazine -sparfloxacin -St. John's Wort -ziprasidone This medicine may also interact with the following medications: -atropine and related drugs like hyoscyamine, scopolamine, tolterodine and others -barbiturate medicines for inducing sleep or treating seizures, like phenobarbital -cimetidine -disulfiram -ethchlorvynol -thyroid hormones such as levothyroxine This list may not describe all possible interactions. Give your health care provider a list of all the medicines, herbs, non-prescription drugs, or dietary supplements you use. Also tell them if you smoke, drink alcohol, or use illegal drugs. Some items may interact with your medicine. What should I watch for while using this medicine? Tell your doctor if your symptoms do not get better or if they get worse. Visit your doctor or health care professional for regular checks on your progress. Because it may take several weeks to see the full effects of this medicine, it is important to continue your treatment as prescribed by your doctor. Patients and their families should watch out for new or worsening thoughts of suicide or depression. Also watch out for sudden changes in feelings such as feeling anxious, agitated, panicky, irritable, hostile, aggressive, impulsive, severely restless, overly excited and hyperactive, or not being able to sleep. If this happens, especially at the beginning of treatment or after a change in dose, call your health care professional. You may get   drowsy or  dizzy. Do not drive, use machinery, or do anything that needs mental alertness until you know how this medicine affects you. Do not stand or sit up quickly, especially if you are an older patient. This reduces the risk of dizzy or fainting spells. Alcohol may interfere with the effect of this medicine. Avoid alcoholic drinks. Do not treat yourself for coughs, colds, or allergies without asking your doctor or health care professional for advice. Some ingredients can increase possible side effects. Your mouth may get dry. Chewing sugarless gum or sucking hard candy, and drinking plenty of water will help. Contact your doctor if the problem does not go away or is severe. This medicine may cause dry eyes and blurred vision. If you wear contact lenses you may feel some discomfort. Lubricating drops may help. See your eye doctor if the problem does not go away or is severe. This medicine can cause constipation. Try to have a bowel movement at least every 2 to 3 days. If you do not have a bowel movement for 3 days, call your doctor or health care professional. This medicine can make you more sensitive to the sun. Keep out of the sun. If you cannot avoid being in the sun, wear protective clothing and use sunscreen. Do not use sun lamps or tanning beds/booths. What side effects may I notice from receiving this medicine? Side effects that you should report to your doctor or health care professional as soon as possible: -allergic reactions like skin rash, itching or hives, swelling of the face, lips, or tongue -abnormal production of milk in females -breast enlargement in both males and females -breathing problems -confusion, hallucinations -fast, irregular heartbeat -fever with increased sweating -muscle stiffness, or spasms -pain or difficulty passing urine, loss of bladder control -seizures -suicidal thoughts or other mood changes -swelling of the testicles -tingling, pain, or numbness in the feet or  hands -yellowing of the eyes or skin Side effects that usually do not require medical attention (report to your doctor or health care professional if they continue or are bothersome): -change in sex drive or performance -constipation or diarrhea -nausea, vomiting -weight gain or loss This list may not describe all possible side effects. Call your doctor for medical advice about side effects. You may report side effects to FDA at 1-800-FDA-1088. Where should I keep my medicine? Keep out of the reach of children. Store at room temperature between 20 and 25 degrees C (68 and 77 degrees F). Throw away any unused medicine after the expiration date. NOTE: This sheet is a summary. It may not cover all possible information. If you have questions about this medicine, talk to your doctor, pharmacist, or health care provider.  2015, Elsevier/[employer] Standard. (2011-12-14 13:50:32)  

## 2014-08-20 NOTE — Progress Notes (Signed)
PATIENT: Kendra Fox DOB: [birth year]  REASON FOR VISIT: routine follow up for facial pain, headache HISTORY FROM: patient  HISTORY OF PRESENT ILLNESS: Kendra Fox is a 49 y.o. female Is seen here as a referral from Dr. Regis Bill and her ENT physician , Dr. Jenetta Loges, for an atypical facial pain.    08-20-14 CD  Kendra Fox is here for her regular the visit as scheduled by Charlott Holler during her last visit. She is a 49 year old female with an atypical facial pain and TMJ. [facility], and now at  [facility]- On her own account and costs.  She gets relief by using massage therapy.  Heat and cold have not been tried, she has used a muscle relaxer .I will change her today to elavil and see if this works better for her. She was advised not to drive within 8 hours and not to drink alcohol with it. She has neither dry mouth nor dry eye.    UPDATE 01/22/14 (LL):  Normal MRI of the brain, Dr. Brett Fairy suggested dental evaluation at a TMJ specialist, [facility] did not accept her insurance.  She went to [facility] and was evaluated and treated with massage and TENS.  Also going to [facility] for occasional massage.  She feels that she has improved, still has mild tension headaches at the end of the day and has had 1 migraine in the last month, relieved with Sumatriptan.  She stopped taking Zonisamide when she started feeling better.   11/15/13 PRIOR HPI (CD):  Kendra Fox is a right-handed, Caucasian female that has been evaluated for what was present to be sinus headache. She has pain in the right maxilla occasionally some nasal congestion and was treated with an antibiotic which only helped a little and only temporarily. Last year she also had a similar spell but in autumn. At that time, she found relief with antibiotic treatment. She really doesn't have a history of prolonged sinusitis or sinus problems, she has no history of bony abnormalities  or structural injuries to the facial skull or airway. She had noticed some discomfort in her right temporal mandibular joint and her dentist has assured her that there are no signs of bruxism when examining her 14 days ago. A sinus film a CT of the sinuses was obtained through Dr. Jenetta Loges, negative for sinusitis.  The patient is a nonsmoker -she quit smoking over 10 years ago,  she does not use alcohol and she uses caffeine in the morning only.  She had one tooth extraction surgery began nor surgery or trauma to the neck airway or facial skull. The pain is of a latent quality, comes and goes, doesn't wake her from sleep nor does she wake up and notices pain first. She hasn't noticed any triggers. One time she noted the pain coming on after exercise, and associated with a right visual aura of zig -zag lines of light in the peripheral field. No nausea, no phonophobia, but photophobia, a slight dizziness, light headedness. There seems no trigger in activity, walking etc.  She has not lost ability to smell or taste. She describes a dull pressure sensation over the right Cheek bone.   Family history 08-20-14, Her mother has migraines.no history otherwise.  No personal history of concussion or contusion, TBI. Migraine:  rare.     Review of Systems:  Out of a complete 14 system review, the patient complains of only the following symptoms, and all other  reviewed systems are negative.  Headache, TMJ pain. Sleep is not affected.   She has only used 2 imitrex since seen last ( 08-20-14). These work well when she has migraines.   ALLERGIES: Allergies  Allergen Reactions  . Minocycline     HOME MEDICATIONS: Outpatient Prescriptions Prior to Visit  Medication Sig Dispense Refill  . ALPRAZolam (XANAX) 0.25 MG tablet TAKE ONE TABLET BY MOUTH AT BEDTIME AS NEEDED  30 tablet 0  . fexofenadine (ALLEGRA) 180 MG tablet Take 180 mg by mouth daily.      Marland Kitchen ibuprofen (ADVIL,MOTRIN) 800 MG tablet TAKE ONE TABLET BY  MOUTH EVERY EIGHT HOURS AS NEEDED  30 tablet 0  . MULTIPLE VITAMIN PO Take by mouth daily.     . SUMAtriptan (IMITREX) 25 MG tablet Take 1 tablet (25 mg total) by mouth once. As needed for Migraine, may repeat in 2 hours if headache persists or recurs. 10 tablet 5  . triamcinolone (NASACORT AQ) 55 MCG/ACT AERO nasal inhaler Place 2 sprays into the nose daily.    Marland Kitchen HYDROcodone-acetaminophen (NORCO/VICODIN) 5-325 MG per tablet Take 1-2 tablets by mouth every 4 (four) hours as needed for moderate pain. 20 tablet 0  . oxyCODONE (OXY IR/ROXICODONE) 5 MG immediate release tablet   0  . zonisamide (ZONEGRAN) 25 MG capsule Take 1 capsule (25 mg total) by mouth daily. (Patient not taking: Reported on 08/20/2014) 30 capsule 5   No facility-administered medications prior to visit.     PHYSICAL EXAM  Filed Vitals:   08/20/14 1138  BP: 111/73  Pulse: 70  Resp: 14  Height: 5\' 6"  (1.676 m)  Weight: 169 lb (76.658 kg)   Body mass index is 27.29 kg/(m^2). No exam data present  Generalized: Well developed, in no acute distress  Head: normocephalic and atraumatic. Oropharynx benign. Mallampati 2 neck circumference: 14.5, no temporal artery hardening or palpable pulse. Neck: Supple, no carotid bruits  Cardiac: Regular rate rhythm, no murmur  Musculoskeletal: No deformity   Neurological examination  Mentation: Alert oriented to time, place, history taking. Follows all commands speech and language fluent Cranial nerve II-XII: Pupils were equal round reactive to light extraocular movements were full, visual field were full on confrontational test. Facial sensation and strength were normal. hearing was intact to finger rubbing bilaterally. Uvula tongue midline. head turning and shoulder shrug and were normal and symmetric.Tongue protrusion into cheek strength was normal. Motor: The motor testing reveals 5 over 5 strength of all 4 extremities. Good symmetric motor tone is noted throughout.  Sensory: Sensory  testing is intact to soft touch on all 4 extremities. No evidence of extinction is noted.  Coordination: Cerebellar testing reveals good finger-nose-finger and heel-to-shin bilaterally.  Gait and station: Gait is normal. Tandem gait is normal. Romberg is negative.  Reflexes: Deep tendon reflexes are symmetric and normal bilaterally.    ASSESSMENT: 49 y.o. year old female  has  DVT (deep vein thrombosis); Fracture; Allergic rhinitis; and  1)  Decreased  severity of headaches here with facial pain,  Having both, TMJ and a  Migraine component.  She has improved since last visit with massage and [facility].  PLAN: Continue Imitrex tablets as needed for acute Migraine ( This is marked by photohobia ) .    Continue massage and [facility] for TMJ related  headache prevention.     Refills have been sent to [facility] on both medications. Follow up in our office in 12 months with NP , sooner as needed.  Please call the office with any questions.  Meds ordered this encounter  Medications  . SUMAtriptan (IMITREX) 25 MG tablet    Sig: Take 1 tablet (25 mg total) by mouth every 2 (two) hours as needed for migraine or headache. May repeat in 2 hours if headache persists or recurs.    Dispense:  10 tablet    Order Specific Question:  Supervising Provider    Answer:  Andrey Spearman R [[id number]]  . zonisamide (ZONEGRAN) 25 MG capsule    Sig: Take 1 capsule (25 mg total) by mouth daily.    Dispense:  30 capsule    Refill:  5    Order Specific Question:  Supervising Provider    AnswerPenni Bombard [[id number]]   08/20/2014, 12:34 PM [facility] [address] [place], [place] [address] [phone]

## 2014-08-24 ENCOUNTER — Other Ambulatory Visit: Payer: Self-pay | Admitting: Internal Medicine

## 2014-08-28 NOTE — Telephone Encounter (Signed)
Ok x 2  

## 2014-08-29 NOTE — Telephone Encounter (Signed)
Sent to the pharmacy by e-scribe. 

## 2015-01-09 ENCOUNTER — Encounter: Payer: Self-pay | Admitting: Internal Medicine

## 2015-01-09 ENCOUNTER — Ambulatory Visit (INDEPENDENT_AMBULATORY_CARE_PROVIDER_SITE_OTHER): Payer: BLUE CROSS/BLUE SHIELD | Admitting: Internal Medicine

## 2015-01-09 VITALS — BP 102/80 | Temp 98.9°F | Ht 66.0 in | Wt 168.6 lb

## 2015-01-09 DIAGNOSIS — E785 Hyperlipidemia, unspecified: Secondary | ICD-10-CM | POA: Diagnosis not present

## 2015-01-09 DIAGNOSIS — E663 Overweight: Secondary | ICD-10-CM

## 2015-01-09 MED ORDER — PHENTERMINE HCL 15 MG PO CAPS: 15.0000 mg | ORAL_CAPSULE | ORAL | Status: DC

## 2015-01-09 NOTE — Patient Instructions (Addendum)
Track eating activity and weight  strategies for  Difficult time .   Phentermine is a controlled substance . And dependent producing  Medicine can aggravate anxiety  And sleep disturbance.   Exercise to Lose Weight Exercise and a healthy diet may help you lose weight. Your doctor may suggest specific exercises. EXERCISE IDEAS AND TIPS  Choose low-cost things you enjoy doing, such as walking, bicycling, or exercising to workout videos.  Take stairs instead of the elevator.  Walk during your lunch break.  Park your car further away from work or school.  Go to a gym or an exercise class.  Start with 5 to 10 minutes of exercise each day. Build up to 30 minutes of exercise 4 to 6 days a week.  Wear shoes with good support and comfortable clothes.  Stretch before and after working out.  Work out until you breathe harder and your heart beats faster.  Drink extra water when you exercise.  Do not do so much that you hurt yourself, feel dizzy, or get very short of breath. Exercises that burn about 150 calories:  Running 1  miles in 15 minutes.  Playing volleyball for 45 to 60 minutes.  Washing and waxing a car for 45 to 60 minutes.  Playing touch football for 45 minutes.  Walking 1  miles in 35 minutes.  Pushing a stroller 1  miles in 30 minutes.  Playing basketball for 30 minutes.  Raking leaves for 30 minutes.  Bicycling 5 miles in 30 minutes.  Walking 2 miles in 30 minutes.  Dancing for 30 minutes.  Shoveling snow for 15 minutes.  Swimming laps for 20 minutes.  Walking up stairs for 15 minutes.  Bicycling 4 miles in 15 minutes.  Gardening for 30 to 45 minutes.  Jumping rope for 15 minutes.  Washing windows or floors for 45 to 60 minutes. Document Released: 08/29/2010 Document Revised: 10/19/2011 Document Reviewed: 08/29/2010 [facility] Patient Information 2015 [place], [place]. This information is not intended to replace advice given to you by your health  care provider. Make sure you discuss any questions you have with your health care provider.  ROV in 3-4 weeks

## 2015-01-09 NOTE — Progress Notes (Signed)
Pre visit review using our clinic review tool, if applicable. No additional management support is needed unless otherwise documented below in the visit note.  Chief Complaint  Patient presents with  . Discuss Weight Loss    Would like to start Adipex.    HPI: Kendra Fox 49 y.o. comesin to discuss concern about weight gain interested in phentermine  . She had done well with exercise and losing weight and then had injury and surgery I was unable to exercise and is very frustrated trying to visit back. She doesn't have diabetes or hypertension. Her anxiety is a bit better and controlled sleep is good.  Below 150.  Goal however 140 would be better After fall put back on weight that she has lost  Has intensified lifestyle intervention over the last 3-4 weeks. Walking   1-2 miles    Higher protein diet.   Diet .  vege   Calories  not eating meat. Trying to decrease processed foods. Has not been on medications before. Anxiety ok   At this time went to beach  Did ok  Not taking recnetly  last 4 week .   ROS: See pertinent positives and negatives per HPI.  Past Medical History  Diagnosis Date  . History of DVT (deep vein thrombosis)     2006   sp ankle fracture  immobilization   . Fracture     left ankle  . Allergic rhinitis   . Increased severity of headaches   . Environmental and seasonal allergies     Ragweed, grasses, dust mite, mold    Family History  Problem Relation Age of Onset  . Migraines Mother   . COPD Father     smoker  . Parkinsonism Father     History   Social History  . Marital Status: Married    Spouse Name: [name]  . Number of Children: 0  . Years of Education: College   Occupational History  .     Social History Main Topics  . Smoking status: Former Research scientist (life sciences)  . Smokeless tobacco: Never Used     Comment: Quit in 2004  . Alcohol Use: 3.6 oz/week    6 Glasses of wine per week     Comment: social  . Drug Use: No  . Sexual Activity: Not on file    Other Topics Concern  . None   Social History Narrative   Patient is married [name])   Patient works at [employer].   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   Married  HHof 2       Regular exercise-no   Vegan soy milk     neg tobacco 1-2 tea    8 hours sleep    Patient is right-handed.   Patient drinks 2-3 cups of coffee daily.   Patient has a college education.    Outpatient Prescriptions Prior to Visit  Medication Sig Dispense Refill  . ALPRAZolam (XANAX) 0.25 MG tablet TAKE ONE TABLET BY MOUTH AT BEDTIME AS NEEDED  30 tablet 0  . fexofenadine (ALLEGRA) 180 MG tablet Take 180 mg by mouth daily.      Marland Kitchen ibuprofen (ADVIL,MOTRIN) 800 MG tablet TAKE ONE TABLET BY MOUTH EVERY EIGHT HOURS AS NEEDED  30 tablet 1  . MULTIPLE VITAMIN PO Take by mouth daily.     . SUMAtriptan (IMITREX) 25 MG tablet Take 1 tablet (25 mg total) by mouth once.  As needed for Migraine, may repeat in 2 hours if headache persists or recurs. 10 tablet 5  . triamcinolone (NASACORT AQ) 55 MCG/ACT AERO nasal inhaler Place 2 sprays into the nose daily.    Marland Kitchen amitriptyline (ELAVIL) 25 MG tablet Take 1 tablet (25 mg total) by mouth at bedtime. (Patient not taking: Reported on 01/09/2015) 30 tablet 3   No facility-administered medications prior to visit.     EXAM:  BP 102/80 mmHg  Temp(Src) 98.9 F (37.2 C) (Oral)  Ht 5\' 6"  (1.676 m)  Wt 168 lb 9.6 oz (76.476 kg)  BMI 27.23 kg/m2  Body mass index is 27.23 kg/(m^2).  GENERAL: vitals reviewed and listed above, alert, oriented, appears well hydrated and in no acute distress she does have central obesity without striae or hair changes. HEENT: atraumatic, conjunctiva  clear, no obvious abnormalities on inspection of external nose and ears OP : no lesion edema or exudate  NECK: no obvious masses on inspection palpation  LUNGS: clear to auscultation bilaterally, no wheezes, rales or rhonchi, good air  movement CV: HRRR, no clubbing cyanosis or  peripheral edema nl cap refill  MS: moves all extremities without noticeable focal  abnormality PSYCH: pleasant and cooperative, no obvious depression or anxiety BP Readings from Last 3 Encounters:  01/09/15 102/80  08/20/14 111/73  06/14/14 85/59   Wt Readings from Last 3 Encounters:  01/09/15 168 lb 9.6 oz (76.476 kg)  08/20/14 169 lb (76.658 kg)  06/14/14 160 lb (72.576 kg)   Lab Results  Component Value Date   WBC 5.7 05/04/2014   HGB 14.8 05/04/2014   HCT 44.0 05/04/2014   PLT 418.0* 05/04/2014   GLUCOSE 83 05/04/2014   CHOL 188 05/04/2014   TRIG 185.0* 05/04/2014   HDL 51.60 05/04/2014   LDLDIRECT 121.0 04/25/2013   LDLCALC 99 05/04/2014   ALT 18 05/04/2014   AST 16 05/04/2014   NA 137 05/04/2014   K 4.0 05/04/2014   CL 104 05/04/2014   CREATININE 0.8 05/04/2014   BUN 10 05/04/2014   CO2 23 05/04/2014   TSH 0.86 05/04/2014    Body mass index is 27.23 kg/(m^2).  ASSESSMENT AND PLAN:  Discussed the following assessment and plan:  Overweight (BMI 25.0-29.9)  Hyperlipidemia - nl hdl lsi Discussed risk-benefit of medicine potential side effects usually not used in BMI below 27 unless comorbidity but patient aware use low-dose potential risk heart valve dependency anxiety sleep etc. Encourage to continue getting a strategy intervention and use appetite suppressant as an aid she also needs to track which she is doing to optimize success. Constant monitoring awareness is necessary but not sufficient. Patient aware wants to proceed we'll start with low dose increase is possible we'll have to stop if aggravates her anxiety. -Patient advised to return or notify health care team  if symptoms worsen ,persist or new concerns arise. Total visit 94mins > 50% spent counseling and coordinating care as indicated in above note and in instructions to patient .    Patient Instructions  Track eating activity and weight  strategies for   Difficult time .   Phentermine is a controlled substance . And dependent producing  Medicine can aggravate anxiety  And sleep disturbance.   Exercise to Lose Weight Exercise and a healthy diet may help you lose weight. Your doctor may suggest specific exercises. EXERCISE IDEAS AND TIPS  Choose low-cost things you enjoy doing, such as walking, bicycling, or exercising to workout videos.  Take stairs instead of the  elevator.  Walk during your lunch break.  Park your car further away from work or school.  Go to a gym or an exercise class.  Start with 5 to 10 minutes of exercise each day. Build up to 30 minutes of exercise 4 to 6 days a week.  Wear shoes with good support and comfortable clothes.  Stretch before and after working out.  Work out until you breathe harder and your heart beats faster.  Drink extra water when you exercise.  Do not do so much that you hurt yourself, feel dizzy, or get very short of breath. Exercises that burn about 150 calories:  Running 1  miles in 15 minutes.  Playing volleyball for 45 to 60 minutes.  Washing and waxing a car for 45 to 60 minutes.  Playing touch football for 45 minutes.  Walking 1  miles in 35 minutes.  Pushing a stroller 1  miles in 30 minutes.  Playing basketball for 30 minutes.  Raking leaves for 30 minutes.  Bicycling 5 miles in 30 minutes.  Walking 2 miles in 30 minutes.  Dancing for 30 minutes.  Shoveling snow for 15 minutes.  Swimming laps for 20 minutes.  Walking up stairs for 15 minutes.  Bicycling 4 miles in 15 minutes.  Gardening for 30 to 45 minutes.  Jumping rope for 15 minutes.  Washing windows or floors for 45 to 60 minutes. Document Released: 08/29/2010 Document Revised: 10/19/2011 Document Reviewed: 08/29/2010 [facility] Patient Information 2015 [place], [place]. This information is not intended to replace advice given to you by your health care provider. Make sure you discuss any questions  you have with your health care provider.  ROV in 3-4 weeks      Wanda K. Panosh M.D.

## 2015-01-29 ENCOUNTER — Ambulatory Visit: Payer: BLUE CROSS/BLUE SHIELD | Admitting: Internal Medicine

## 2015-03-06 ENCOUNTER — Ambulatory Visit (INDEPENDENT_AMBULATORY_CARE_PROVIDER_SITE_OTHER): Payer: BLUE CROSS/BLUE SHIELD | Admitting: Internal Medicine

## 2015-03-06 ENCOUNTER — Encounter: Payer: Self-pay | Admitting: Internal Medicine

## 2015-03-06 VITALS — BP 112/80 | Temp 98.7°F | Wt 163.0 lb

## 2015-03-06 DIAGNOSIS — F418 Other specified anxiety disorders: Secondary | ICD-10-CM | POA: Diagnosis not present

## 2015-03-06 DIAGNOSIS — E663 Overweight: Secondary | ICD-10-CM | POA: Diagnosis not present

## 2015-03-06 DIAGNOSIS — Z79899 Other long term (current) drug therapy: Secondary | ICD-10-CM | POA: Diagnosis not present

## 2015-03-06 MED ORDER — PHENTERMINE HCL 15 MG PO CAPS: 15.0000 mg | ORAL_CAPSULE | ORAL | Status: DC

## 2015-03-06 MED ORDER — IBUPROFEN 800 MG PO TABS: 800.0000 mg | ORAL_TABLET | Freq: Three times a day (TID) | ORAL | Status: DC | PRN

## 2015-03-06 MED ORDER — ALPRAZOLAM 0.25 MG PO TABS: 0.2500 mg | ORAL_TABLET | Freq: Every evening | ORAL | Status: DC | PRN

## 2015-03-06 NOTE — Progress Notes (Signed)
Pre visit review using our clinic review tool, if applicable. No additional management support is needed unless otherwise documented below in the visit note.  Chief Complaint  Patient presents with  . Follow-up    HPI: Comes in for follow up of  medical issues. Follow-up weight overweight beginning phentermine. Not taking on weekends  And Mon  through thursdays.    Not walking recnetly  And then plateaus    8-9 pounds   Diet  Best prepared foods   On own  No sig side effectgs  Except early on when affected sleep but now no problem in sleep.   Takes about 7 am  .   She also needs refills on the ibuprofen which he takes as needed for cramps or other and alprazolam which she still has a few left over from last year #30 and takes only as needed usually with traveling and sleep.  ROS: See pertinent positives and negatives per HPI.  Past Medical History  Diagnosis Date  . History of DVT (deep vein thrombosis)     2006   sp ankle fracture  immobilization   . Fracture     left ankle  . Allergic rhinitis   . Increased severity of headaches   . Environmental and seasonal allergies     Ragweed, grasses, dust mite, mold    Family History  Problem Relation Age of Onset  . Migraines Mother   . COPD Father     smoker  . Parkinsonism Father     History   Social History  . Marital Status: Married    Spouse Name: [name]  . Number of Children: 0  . Years of Education: College   Occupational History  .     Social History Main Topics  . Smoking status: Former Research scientist (life sciences)  . Smokeless tobacco: Never Used     Comment: Quit in 2004  . Alcohol Use: 3.6 oz/week    6 Glasses of wine per week     Comment: social  . Drug Use: No  . Sexual Activity: Not on file   Other Topics Concern  . None   Social History Narrative   Patient is married [name])   Patient works at [employer].   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   Married  HHof 2       Regular exercise-no   Vegan soy milk     neg tobacco 1-2 tea    8 hours sleep    Patient is right-handed.   Patient drinks 2-3 cups of coffee daily.   Patient has a college education.    Outpatient Prescriptions Prior to Visit  Medication Sig Dispense Refill  . b complex vitamins tablet Take 1 tablet by mouth daily.    . MULTIPLE VITAMIN PO Take by mouth daily.     . Probiotic Product (PROBIOTIC PO) Take 1 tablet by mouth daily.    . SUMAtriptan (IMITREX) 25 MG tablet Take 1 tablet (25 mg total) by mouth once. As needed for Migraine, may repeat in 2 hours if headache persists or recurs. 10 tablet 5  . ALPRAZolam (XANAX) 0.25 MG tablet TAKE ONE TABLET BY MOUTH AT BEDTIME AS NEEDED  30 tablet 0  . ibuprofen (ADVIL,MOTRIN) 800 MG tablet TAKE ONE TABLET BY MOUTH EVERY EIGHT HOURS AS NEEDED  30 tablet 1  . phentermine 15 MG capsule Take 1 capsule (15 mg total) by mouth  every morning. 30 capsule 1  . fexofenadine (ALLEGRA) 180 MG tablet Take 180 mg by mouth daily.      Marland Kitchen triamcinolone (NASACORT AQ) 55 MCG/ACT AERO nasal inhaler Place 2 sprays into the nose daily.     No facility-administered medications prior to visit.     EXAM:  BP 112/80 mmHg  Temp(Src) 98.7 F (37.1 C) (Oral)  Wt 163 lb (73.936 kg)  Body mass index is 26.32 kg/(m^2).  GENERAL: vitals reviewed and listed above, alert, oriented, appears well hydrated and in no acute distress HEENT: atraumatic, conjunctiva  clear, no obvious abnormalities on inspection of external nose and ears PSYCH: pleasant and cooperative, no obvious depression or anxiety Wt Readings from Last 3 Encounters:  03/06/15 163 lb (73.936 kg)  01/09/15 168 lb 9.6 oz (76.476 kg)  08/20/14 169 lb (76.658 kg)   BP Readings from Last 3 Encounters:  03/06/15 112/80  01/09/15 102/80  08/20/14 111/73    ASSESSMENT AND PLAN:  Discussed the following assessment and plan:  Overweight (BMI 25.0-29.9) - 5 # weight loss  benfit  more than risk can rx antoher month total 3 then rov   Medication management  Situational anxiety - rare situational use of med if needed  Wt Readings from Last 3 Encounters:  03/06/15 163 lb (73.936 kg)  01/09/15 168 lb 9.6 oz (76.476 kg)  08/20/14 169 lb (76.658 kg)   Working as effective  -Patient advised to return or notify health care team  if symptoms worsen ,persist or new concerns arise.  Patient Instructions  Ok to continue the phentermine  For now  Avoiding side effects .  Ok to refill for another month .  Use alprazolam cautiously as discussed .     Standley Brooking. Panosh M.D.

## 2015-03-06 NOTE — Patient Instructions (Signed)
Ok to continue the phentermine  For now  Avoiding side effects .  Ok to refill for another month .  Use alprazolam cautiously as discussed .

## 2015-08-15 IMAGING — CR DG ANKLE COMPLETE 3+V*L*
3 series · 3 of 3 positions shown · non-contrast
Comparison: None.

CLINICAL DATA: Pain post trauma

EXAM:
LEFT ANKLE COMPLETE - 3+ VIEW

[x ankle lat left]
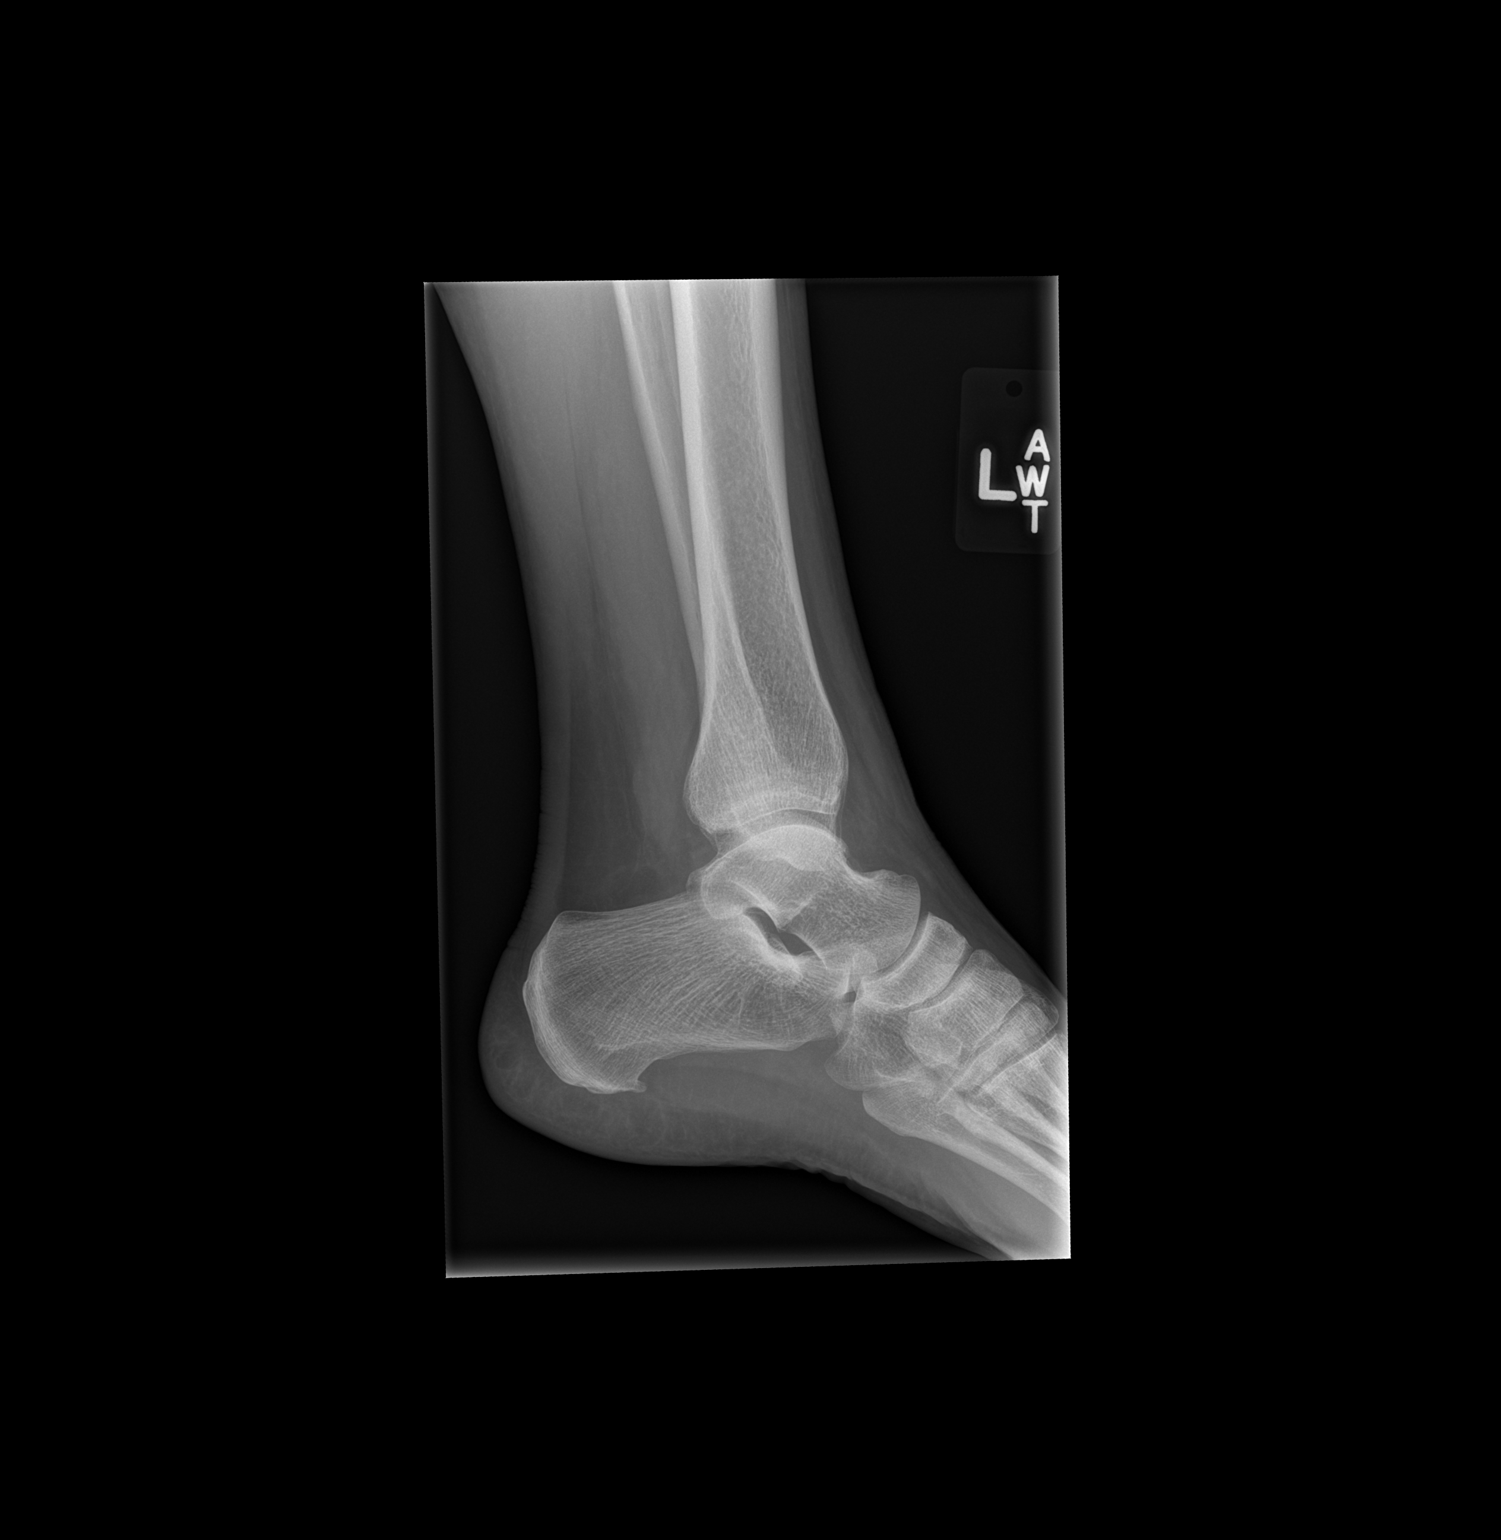

[x ankle ap left]
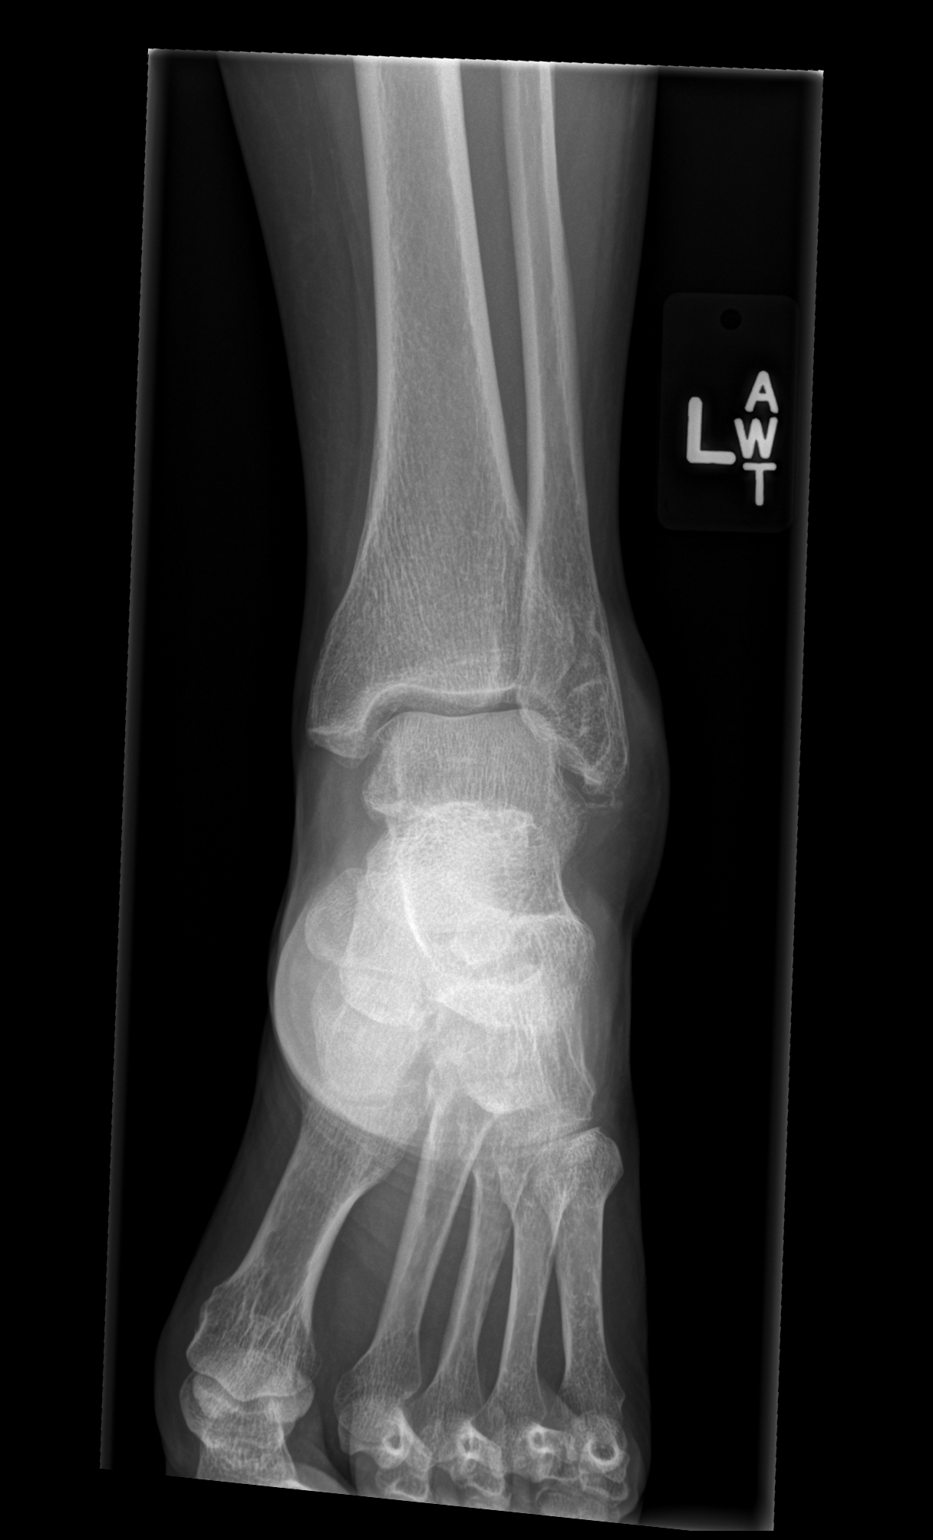

[x ankle obl left]
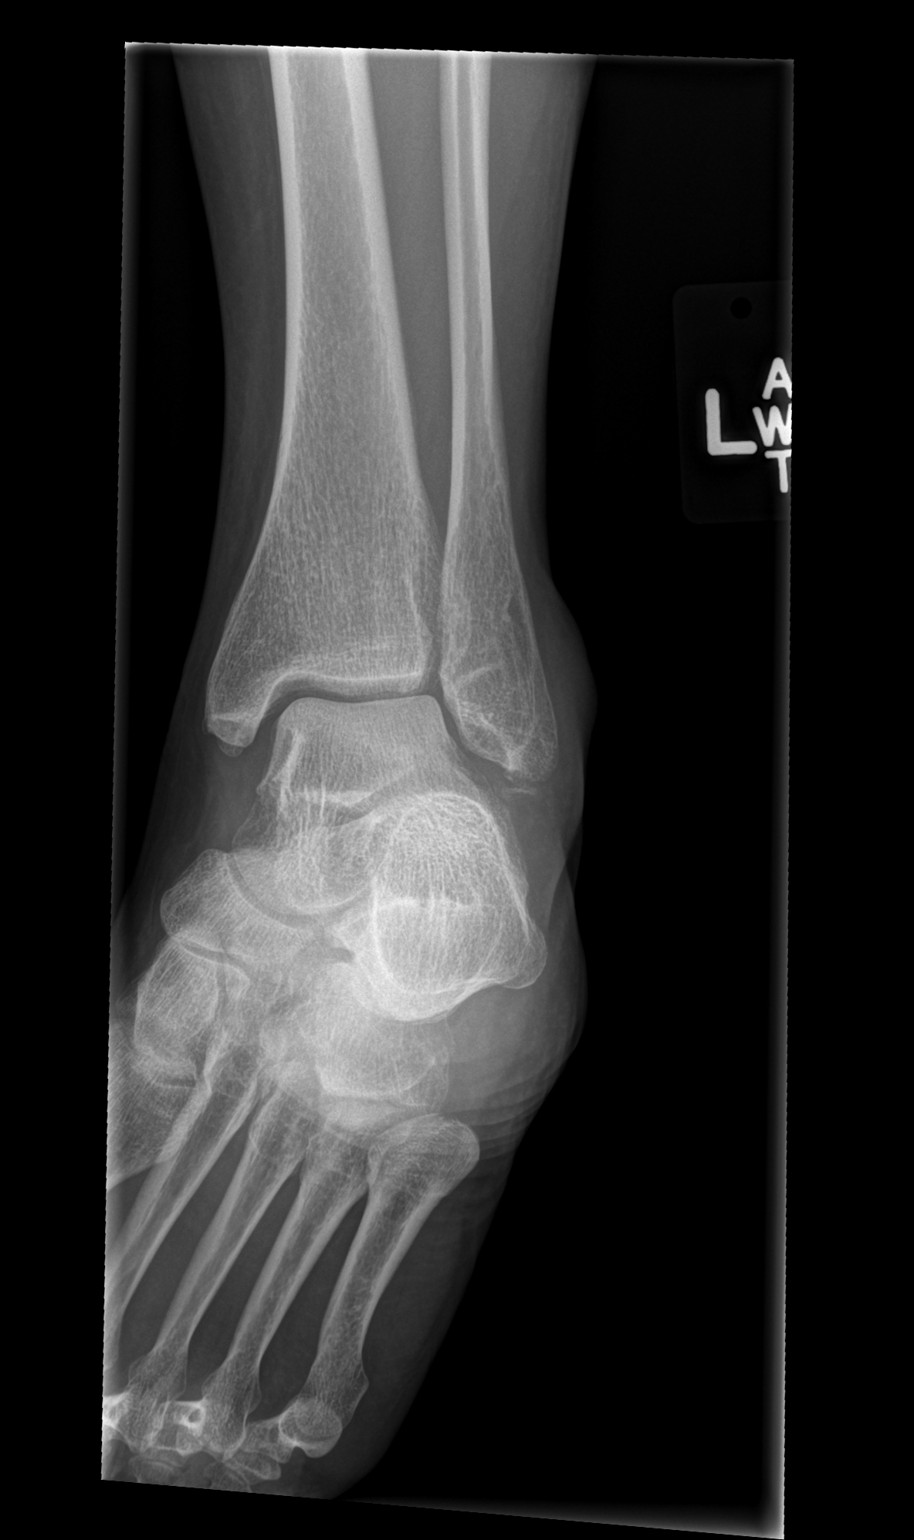

[3 of 3 positions shown; findings below may reference images not displayed]

FINDINGS: Frontal, oblique, and lateral views were obtained. There is soft
tissue swelling laterally. There is avulsion arising from the
lateral malleolus. No other fracture. No effusion. Ankle mortise
appears intact. There is a small inferior calcaneal spur.
IMPRESSION: Avulsion arising from the lateral malleolus with soft tissue
swelling laterally. Ankle mortise appears intact.

## 2015-08-15 IMAGING — CR DG FOOT COMPLETE 3+V*L*
3 series · 3 of 3 positions shown · non-contrast
Comparison: None.

CLINICAL DATA: Fall.  LEFT foot pain.

EXAM:
LEFT FOOT - COMPLETE 3+ VIEW

[x foot ap left]
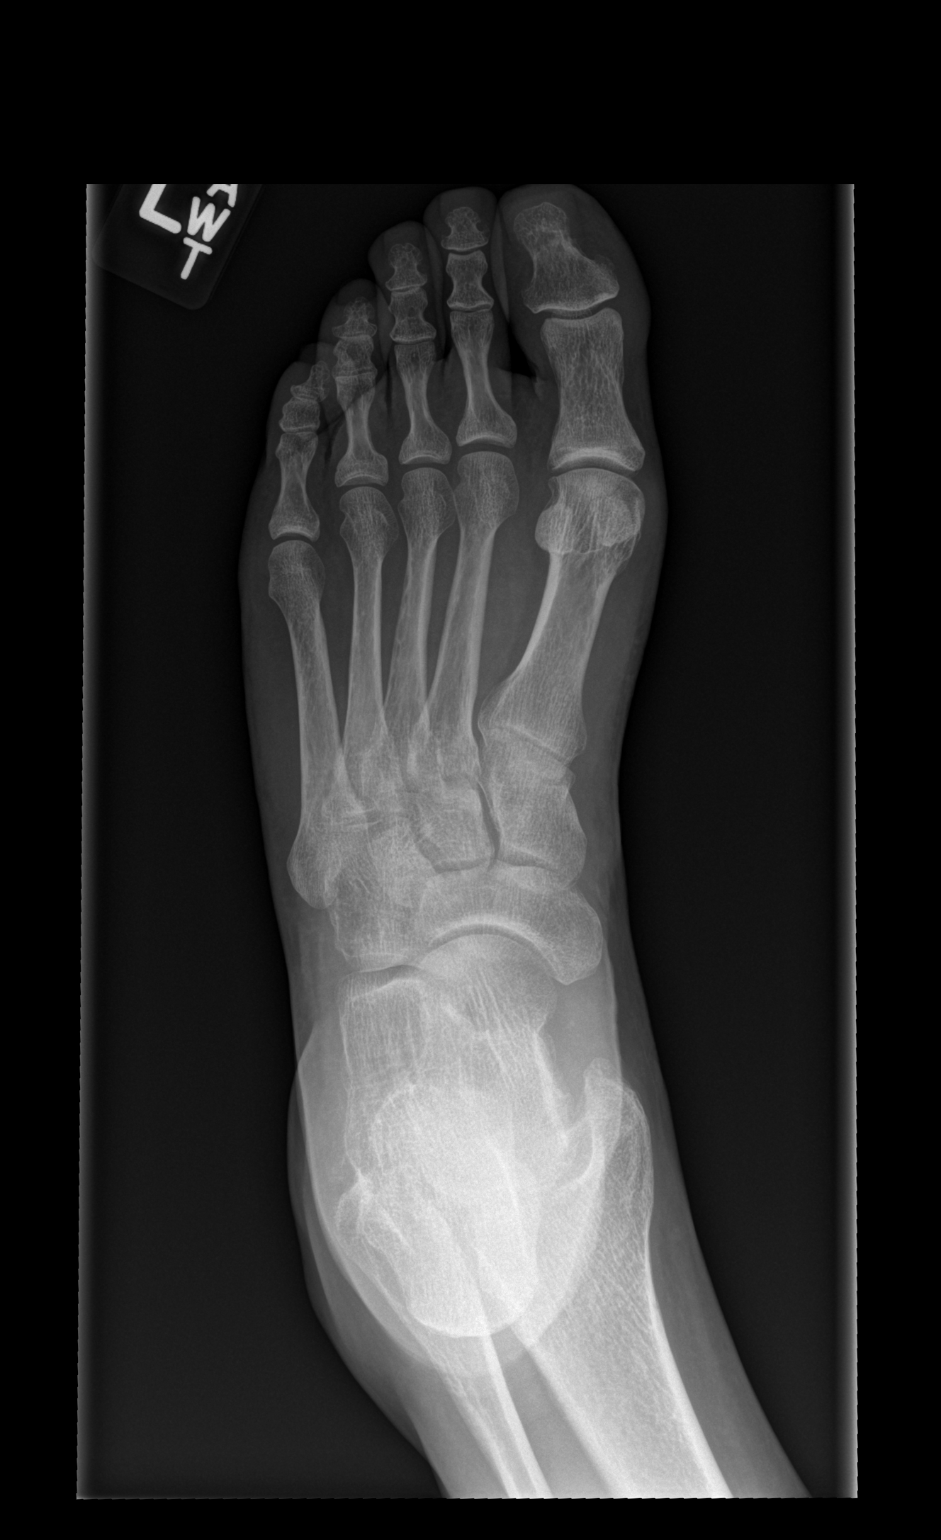

[x foot obl left]
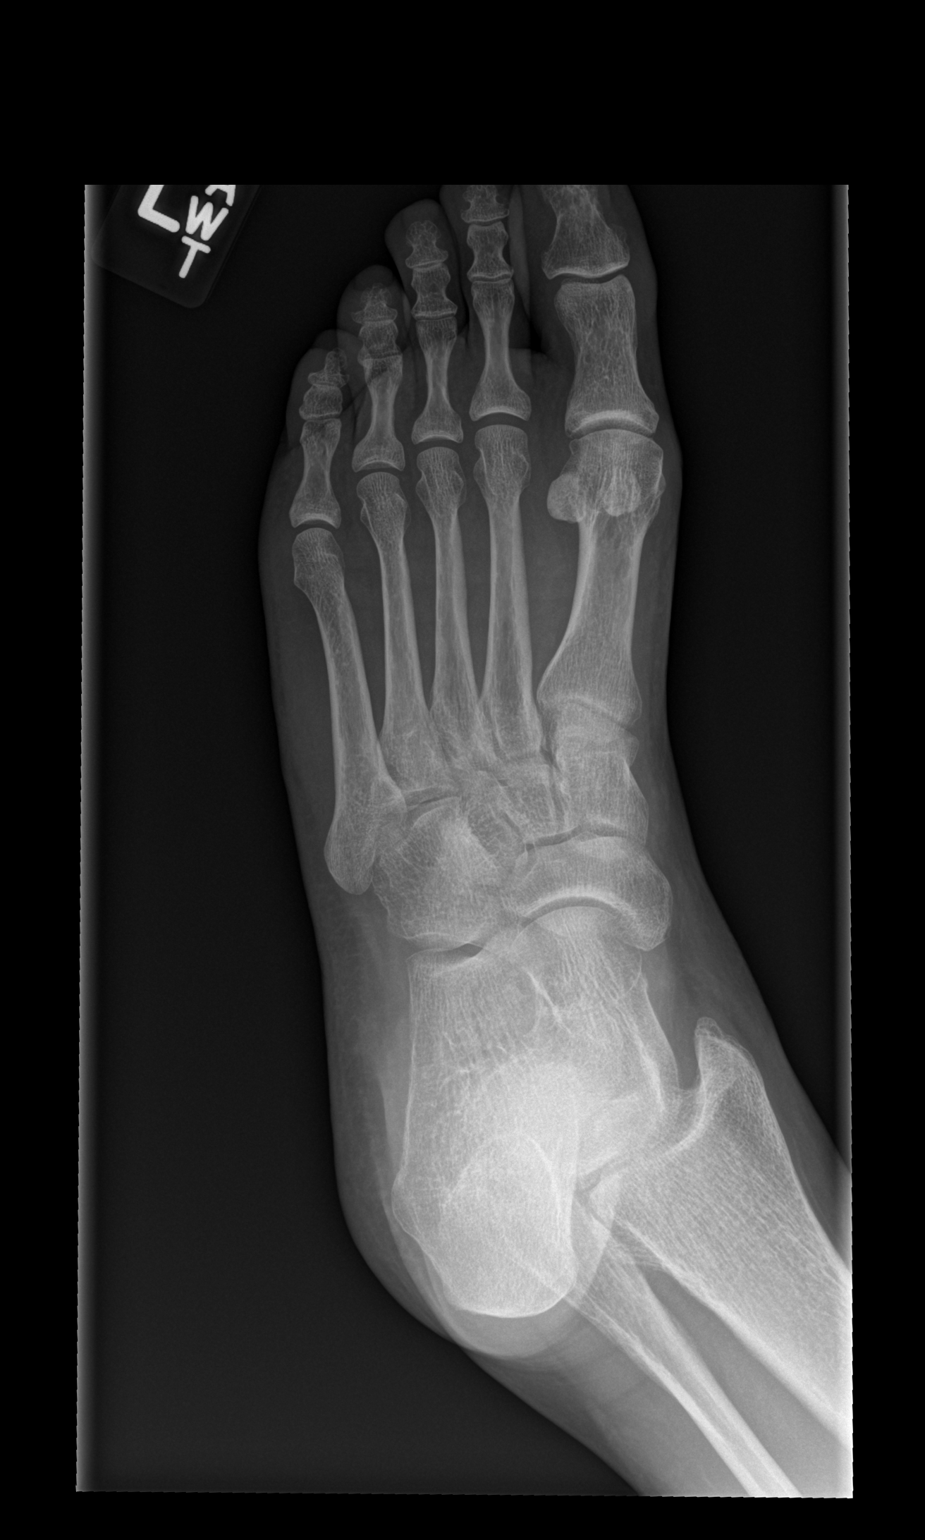

[x foot lat left]
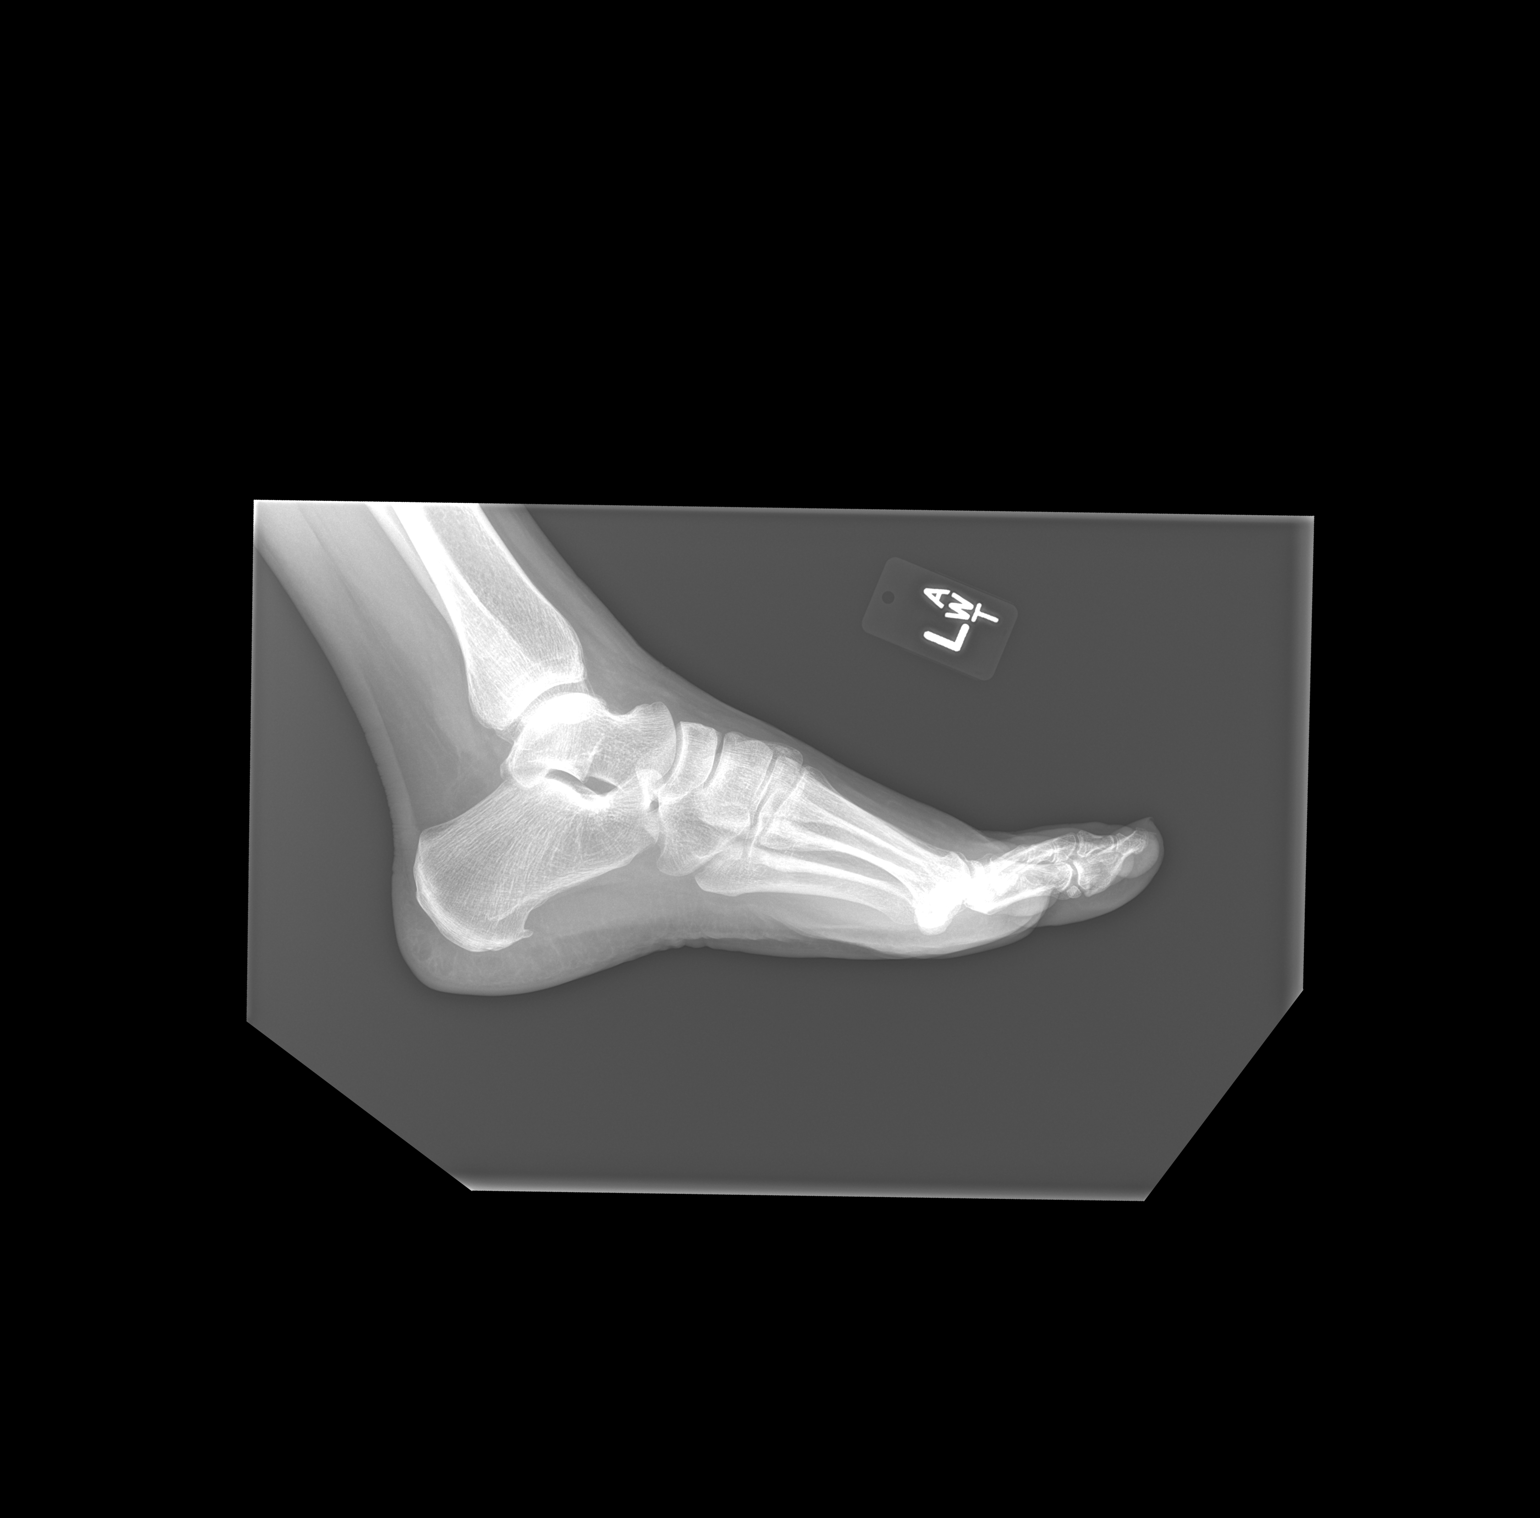

[3 of 3 positions shown; findings below may reference images not displayed]

FINDINGS: Anatomic alignment of the bones of the foot. Bony density is present
near the tip of the lateral malleolus, likely representing avulsion
fracture. Midfoot bones appear within normal limits. Calcaneal spurs
incidentally noted.
IMPRESSION: Tiny avulsion fracture fragments adjacent to the lateral malleolus.

## 2015-09-22 ENCOUNTER — Other Ambulatory Visit: Payer: Self-pay | Admitting: Internal Medicine

## 2015-09-24 NOTE — Telephone Encounter (Signed)
Ok to refill x 2  

## 2015-09-25 NOTE — Telephone Encounter (Signed)
Sent to the pharmacy by e-scribe. 

## 2015-12-02 ENCOUNTER — Telehealth: Payer: Self-pay | Admitting: Neurology

## 2015-12-02 DIAGNOSIS — M26609 Unspecified temporomandibular joint disorder, unspecified side: Secondary | ICD-10-CM

## 2015-12-02 DIAGNOSIS — R519 Headache, unspecified: Secondary | ICD-10-CM

## 2015-12-02 DIAGNOSIS — R51 Headache: Secondary | ICD-10-CM

## 2015-12-02 MED ORDER — SUMATRIPTAN SUCCINATE 25 MG PO TABS: 25.0000 mg | ORAL_TABLET | Freq: Once | ORAL | Status: DC

## 2015-12-02 NOTE — Telephone Encounter (Signed)
Patient is calling to get a new Rx sumatriptan 25 mg. She has scheduled a f/up medication appt for 01/15/16.  Thanks!

## 2015-12-02 NOTE — Telephone Encounter (Signed)
Called pt to advise her that her sumatriptan was refilled and sent to the [facility] on [address]. No answer, left a message asking her to call me back. If pt calls back, please advise her of this information.

## 2015-12-04 NOTE — Telephone Encounter (Signed)
Pt returned call . She was told about medication being sent to her pharmacy. She expressed understanding.

## 2016-01-15 ENCOUNTER — Encounter: Payer: Self-pay | Admitting: Neurology

## 2016-01-15 ENCOUNTER — Ambulatory Visit (INDEPENDENT_AMBULATORY_CARE_PROVIDER_SITE_OTHER): Payer: BLUE CROSS/BLUE SHIELD | Admitting: Neurology

## 2016-01-15 VITALS — BP 110/76 | HR 80 | Resp 20 | Ht 65.0 in | Wt 162.0 lb

## 2016-01-15 DIAGNOSIS — G5602 Carpal tunnel syndrome, left upper limb: Secondary | ICD-10-CM | POA: Diagnosis not present

## 2016-01-15 DIAGNOSIS — M26629 Arthralgia of temporomandibular joint, unspecified side: Secondary | ICD-10-CM

## 2016-01-15 NOTE — Progress Notes (Signed)
PATIENT: Kendra Fox DOB: [birth year]  REASON FOR VISIT: routine follow up for facial pain, headache HISTORY FROM: patient  HISTORY OF PRESENT ILLNESS: Kendra Fox is a 50 y.o. female Is seen here as a referral from Dr. Regis Bill and Kendra [facility]NT physician , Dr. Jenetta Loges, for an atypical facial pain.  Interval history from 01/15/2016. Kendra Fox has been doing well with Kendra TMJ pain and facial pain and continues to be on the same medication. She has a new concern of finger and hand numbness from the palmar aspect of the hand not affecting the dorsum. The numbness can last all day after she woke up with it. She has tried some massage therapy, attributing the numbness to a possible pinched nerve in Kendra neck. Indeed she felt some relief after massage therapy. But the symptoms have returned since.     08-20-14 CD Kendra Fox is here for Kendra regular the visit as scheduled by Kendra Fox during Kendra last visit. She is a 50 year old female with an atypical facial pain and TMJ. [facility], and now at  [facility]- On Kendra own account and costs. She gets relief by using massage therapy. Heat and cold have not been tried, she has used a muscle relaxer .I will change Kendra today to elavil and see if this works better for Kendra. She was advised not to drive within 8 hours and not to drink alcohol with it. She has neither dry mouth nor dry eye.   UPDATE 01/22/14 (LL):  Normal MRI of the brain, Dr. Brett Fairy suggested dental evaluation at a TMJ specialist, [facility] did not accept Kendra insurance.  She went to [facility] and was evaluated and treated with massage and TENS.  Also going to [facility] for occasional massage.  She feels that she has improved, still has mild tension headaches at the end of the day and has had 1 migraine in the last month, relieved with Sumatriptan.  She stopped taking Zonisamide when she started feeling better.   11/15/13 PRIOR HPI  (VP):  Kendra Fox is a right-handed, Caucasian female that has been evaluated for what was present to be sinus headache. She has pain in the right maxilla- occasionally some nasal congestion- and was treated with an antibiotic, which only helped a little and only temporarily.  Last year she also had a similar spell but in autumn. At that time, she found relief with antibiotic treatment. She really doesn't have a history of prolonged sinusitis or sinus problems, she has no history of bony abnormalities or structural injuries to the facial skull or airway. She had noticed some discomfort in Kendra right temporal mandibular joint and Kendra dentist has assured Kendra that there are no signs of bruxism when examining Kendra 14 days ago. A sinus film a CT of the sinuses was obtained through Dr. Jenetta Loges, negative for sinusitis.  The patient is a nonsmoker -she quit smoking over 10 years ago,  she does not use alcohol and she uses caffeine in the morning only.  She had one tooth extraction surgery began nor surgery or trauma to the neck airway or facial skull. The pain is of a latent quality, comes and goes, doesn't wake Kendra from sleep nor does she wake up and notices pain first. She hasn't noticed any triggers. One time she noted the pain coming on after exercise, and associated with a right visual aura of zig -zag lines of light in the peripheral field. No  nausea, no phonophobia, but photophobia, a slight dizziness, light headedness. There seems no trigger in activity, walking etc.  She has not lost ability to smell or taste. She describes a dull pressure sensation over the right Cheek bone.   Family history 08-20-14, Kendra Fox has migraines.no history otherwise.  No personal history of concussion or contusion, TBI. Migraine:  rare.     Review of Systems:  Out of a complete 14 system review, the patient complains of only the following symptoms, and all other reviewed systems are negative.  Headache, TMJ pain. Sleep is  not affected.   She has only used 2 imitrex since seen last ( 08-20-14). These work well when she has migraines.   ALLERGIES: Allergies  Allergen Reactions  . Minocycline     HOME MEDICATIONS: Outpatient Prescriptions Prior to Visit  Medication Sig Dispense Refill  . ALPRAZolam (XANAX) 0.25 MG tablet Take 1 tablet (0.25 mg total) by mouth at bedtime as needed. 30 tablet 0  . b complex vitamins tablet Take 1 tablet by mouth daily.    . fexofenadine (ALLEGRA) 180 MG tablet Take 180 mg by mouth daily.      Marland Kitchen ibuprofen (ADVIL,MOTRIN) 800 MG tablet TAKE 1 TABLET (800 MG TOTAL) BY MOUTH EVERY 8 (EIGHT) HOURS AS NEEDED. 30 tablet 1  . MULTIPLE VITAMIN PO Take by mouth daily.     . Probiotic Product (PROBIOTIC PO) Take 1 tablet by mouth daily.    . SUMAtriptan (IMITREX) 25 MG tablet Take 1 tablet (25 mg total) by mouth once. As needed for Migraine, may repeat in 2 hours if headache persists or recurs. 10 tablet 0  . phentermine 15 MG capsule Take 1 capsule (15 mg total) by mouth every morning. 30 capsule 0  . triamcinolone (NASACORT AQ) 55 MCG/ACT AERO nasal inhaler Place 2 sprays into the nose daily.     No facility-administered medications prior to visit.     PHYSICAL EXAM  Filed Vitals:   01/15/16 1419  BP: 110/76  Pulse: 80  Resp: 20  Height: 5\' 5"  (1.651 m)  Weight: 162 lb (73.483 kg)   Body mass index is 26.96 kg/(m^2). No exam data present  Generalized: Well developed, in no acute distress  Head: normocephalic and atraumatic. Oropharynx benign. Mallampati 2 neck circumference: 14.5, no temporal artery hardening or palpable pulse. Neck: Supple, no carotid bruits  Cardiac: Regular rate rhythm, no murmur  Musculoskeletal: No deformity   Neurological examination  Mentation: Alert oriented to time, place, history taking. Follows all commands speech and language fluent Cranial nerve: Normal r taste and smell, Pupils were equal round reactive to light, the  extraocular  movements were full, visual field were full on confrontational test.  Facial sensation and strength were normal. hearing was intact to finger rubbing bilaterally. Uvula tongue midline. head turning and shoulder shrug and were normal and symmetric.Tongue protrusion into cheek strength was normal. Motor: The motor testing reveals 5 / 5 strength of all 4 extremities. Normal  motor tone is noted throughout.  I noted a thenar eminence atrophy of the left thumb, with finger flexion the index finger and middle finger of the left hand are not completely able to flex - slightly stiffer. Positive Tinel sign. The patient will be worked up for carpal tunnel.   Sensory: Sensory testing is intact to soft touch on all 4 extremities. No evidence of extinction is noted.  Coordination: Cerebellar testing reveals good finger-nose-finger and heel-to-shin bilaterally.  Gait and station: Gait is  normal. Tandem gait is normal. Romberg is negative.  Reflexes: Deep tendon reflexes are symmetric and normal bilaterally.    ASSESSMENT: 50 y.o. year old female  has  DVT (deep vein thrombosis); Fracture; Allergic rhinitis; and   1) suspcted carpal tunnel, left dominant . Patient will have NCV and EMG. Braces and surgery are possible options.    2)Decreased severity of headaches here with facial pain.  Having both, TMJ and a  Migraine component.  She has improved since last visit with massage and [facility].  3)She has rosacea , worsening over the last 2 years.    PLAN: Continue Imitrex tablets as needed for acute Migraine ( This is marked by photohobia ) .  Continue massage and [facility] for TMJ related  headache prevention. Refills have been sent to [facility] on both medications by Dr Leta Baptist. Follow up in our office in 1-2 months with NP , sooner as needed.   Please call the office with any questions.  Meds ordered this encounter  Medications  . SUMAtriptan (IMITREX) 25 MG tablet    Sig:  Take 1 tablet (25 mg total) by mouth every 2 (two) hours as needed for migraine or headache. May repeat in 2 hours if headache persists or recurs.    Dispense:  10 tablet    Order Specific Question:  Supervising Provider    Answer:  Andrey Spearman R [[id number]]  . zonisamide (ZONEGRAN) 25 MG capsule    Sig: Take 1 capsule (25 mg total) by mouth daily.    Dispense:  30 capsule    Refill:  5    Order Specific Question:  Supervising Provider    Answer:  Penni Bombard [[id number]]   The patient will have a nerve conduction study EMG for carpal tunnel syndrome versus cervical radiculopathy C5-6. I would like Kendra to have a follow-up with my nurse practitioner within the next 4 weeks as I will be on vacation at that time. The possibility of treating a carpal tunnel syndrome with braces or surgery has been mentioned. The patient is not aware of diabetes or thyroid disease being present, and she is also not pregnant. Endocrine disorders are very commonly triggers for carpal tunnel syndrome.     Chauntae Hults, MD  01/15/2016, 3:11 PM [facility] [address] [place], [place] [address] [phone]

## 2016-02-24 ENCOUNTER — Other Ambulatory Visit: Payer: Self-pay | Admitting: Internal Medicine

## 2016-02-25 ENCOUNTER — Telehealth: Payer: Self-pay | Admitting: Family Medicine

## 2016-02-25 NOTE — Telephone Encounter (Signed)
Pt due for follow up per [facility].  Please help the pt to make an appointment.  Thanks!

## 2016-02-25 NOTE — Telephone Encounter (Signed)
Last visit 7 2016 Can refill x 1  OV before any further refills

## 2016-02-25 NOTE — Telephone Encounter (Signed)
Sent to the pharmacy by e-scribe.  Message sent to scheduling. 

## 2016-02-25 NOTE — Telephone Encounter (Signed)
lmom for pt to call back

## 2016-02-26 ENCOUNTER — Ambulatory Visit (INDEPENDENT_AMBULATORY_CARE_PROVIDER_SITE_OTHER): Payer: BLUE CROSS/BLUE SHIELD | Admitting: Neurology

## 2016-02-26 ENCOUNTER — Encounter: Payer: Self-pay | Admitting: Neurology

## 2016-02-26 ENCOUNTER — Ambulatory Visit (INDEPENDENT_AMBULATORY_CARE_PROVIDER_SITE_OTHER): Payer: Self-pay | Admitting: Neurology

## 2016-02-26 DIAGNOSIS — G5603 Carpal tunnel syndrome, bilateral upper limbs: Secondary | ICD-10-CM

## 2016-02-26 DIAGNOSIS — M26629 Arthralgia of temporomandibular joint, unspecified side: Secondary | ICD-10-CM

## 2016-02-26 DIAGNOSIS — G5602 Carpal tunnel syndrome, left upper limb: Secondary | ICD-10-CM | POA: Diagnosis not present

## 2016-02-26 HISTORY — DX: Carpal tunnel syndrome, bilateral upper limbs: G56.03

## 2016-02-26 NOTE — Procedures (Signed)
     HISTORY:  Kendra Fox is a 50 year old patient with a history of numbness in the hands bilaterally since January 2017. The numbness will wake her up at night, she denies any pain or discomfort in the neck or shoulders. The patient is being evaluated for a possible neuropathy or a cervical radiculopathy.  NERVE CONDUCTION STUDIES:  Nerve conduction studies were performed on both upper extremities. The distal motor latencies for the median nerves were prolonged bilaterally, with normal motor amplitudes for these nerves bilaterally. The distal motor latencies and motor amplitudes for the ulnar nerves were normal bilaterally. The F wave latencies and nerve conduction velocities for the median and ulnar nerves were normal bilaterally. The sensory latencies for the median nerves were prolonged bilaterally, normal for the ulnar nerves bilaterally.  EMG STUDIES:  A limited EMG study was performed on the left upper extremity:  The first dorsal interosseous muscle reveals 2 to 4 K units with full recruitment. No fibrillations or positive waves were noted. The abductor pollicis brevis muscle reveals 2 to 4 K units with full recruitment. No fibrillations or positive waves were noted. The extensor indicis proprius muscle reveals 1 to 3 K units with full recruitment. No fibrillations or positive waves were noted.  The patient refused further study.  IMPRESSION:  Nerve conduction studies done on both upper extremities shows evidence of mild bilateral carpal tunnel syndrome. EMG evaluation of the left upper extremity was limited secondary to patient refusal, the limited study was unremarkable.  Jill Alexanders MD 02/26/2016 1:47 PM  [facility] [address] [place], [place] [address]  Phone [phone] Fax [phone]

## 2016-02-26 NOTE — Telephone Encounter (Signed)
Pt has been scheduled.  °

## 2016-02-26 NOTE — Progress Notes (Signed)
Please refer to EMG and nerve conduction study procedure note. 

## 2016-02-27 ENCOUNTER — Ambulatory Visit (INDEPENDENT_AMBULATORY_CARE_PROVIDER_SITE_OTHER): Payer: BLUE CROSS/BLUE SHIELD | Admitting: Adult Health

## 2016-02-27 ENCOUNTER — Encounter: Payer: Self-pay | Admitting: Adult Health

## 2016-02-27 VITALS — BP 118/70 | HR 67 | Ht 65.0 in | Wt 166.0 lb

## 2016-02-27 DIAGNOSIS — G5603 Carpal tunnel syndrome, bilateral upper limbs: Secondary | ICD-10-CM

## 2016-02-27 NOTE — Progress Notes (Signed)
I agree with the assessment and plan as directed by NP .The patient is known to me .   Korrine Sicard, MD  

## 2016-02-27 NOTE — Progress Notes (Signed)
PATIENT: Kendra Fox DOB: 1966-05-09  REASON FOR VISIT: follow up nerve conduction study results HISTORY FROM: patient  HISTORY OF PRESENT ILLNESS:  Today 02/27/2016: [name] is a 50 year old female with a history of numbness in the hands. She returns today to review her nerve conduction study. Her study indicates that she does have mild bilateral carpal tunnel in both hands. She was unable to proceed with the EMG portion of the test. She does state that she has numbness in the hands throughout the day and during the night. She's been using wrist splints during the night and has noticed good benefit. She is beginning to use wrist plans during the day when she is driving. She returns today for evaluation.  HISTORY 01/15/16 ([place]): Kendra Fox is a 50 y.o. female Is seen here as a referral from Dr. Regis Bill and her ENT physician , Dr. Jenetta Loges, for an atypical facial pain.  Interval history from 01/15/2016. Kendra Fox has been doing well with her TMJ pain and facial pain and continues to be on the same medication. She has a new concern of finger and hand numbness from the palmar aspect of the hand not affecting the dorsum. The numbness can last all day after she woke up with it. She has tried some massage therapy, attributing the numbness to a possible pinched nerve in her neck. Indeed she felt some relief after massage therapy. But the symptoms have returned since.   08-20-14 CD Kendra Fox is here for her regular the visit as scheduled by Charlott Holler during her last visit. She is a 50 year old female with an atypical facial pain and TMJ. [facility], and now at [facility]- On her own account and costs. She gets relief by using massage therapy. Heat and cold have not been tried, she has used a muscle relaxer .I will change her today to elavil and see if this works better for her. She was advised not to drive within 8 hours and not to drink  alcohol with it. She has neither dry mouth nor dry eye.   UPDATE 01/22/14 (LL): Normal MRI of the brain, Dr. Brett Fairy suggested dental evaluation at a TMJ specialist, [facility] did not accept her insurance. She went to [facility] and was evaluated and treated with massage and TENS. Also going to [facility] for occasional massage. She feels that she has improved, still has mild tension headaches at the end of the day and has had 1 migraine in the last month, relieved with Sumatriptan. She stopped taking Zonisamide when she started feeling better.   11/15/13 PRIOR HPI (VP): Kendra Fox is a right-handed, Caucasian female that has been evaluated for what was present to be sinus headache. She has pain in the right maxilla- occasionally some nasal congestion- and was treated with an antibiotic, which only helped a little and only temporarily.  Last year she also had a similar spell but in autumn. At that time, she found relief with antibiotic treatment. She really doesn't have a history of prolonged sinusitis or sinus problems, she has no history of bony abnormalities or structural injuries to the facial skull or airway. She had noticed some discomfort in her right temporal mandibular joint and her dentist has assured her that there are no signs of bruxism when examining her 14 days ago. A sinus film a CT of the sinuses was obtained through Dr. Jenetta Loges, negative for sinusitis.  The patient is a nonsmoker -she quit  smoking over 10 years ago, she does not use alcohol and she uses caffeine in the morning only.  She had one tooth extraction surgery began nor surgery or trauma to the neck airway or facial skull. The pain is of a latent quality, comes and goes, doesn't wake her from sleep nor does she wake up and notices pain first. She hasn't noticed any triggers. One time she noted the pain coming on after exercise, and associated with a right visual aura of zig -zag lines of light in the  peripheral field. No nausea, no phonophobia, but photophobia, a slight dizziness, light headedness. There seems no trigger in activity, walking etc. She has not lost ability to smell or taste. She describes a dull pressure sensation over the right Cheek bone.   Family history 08-20-14, Her mother has migraines.no history otherwise.  No personal history of concussion or contusion, TBI. Migraine: rare.   REVIEW OF SYSTEMS: Out of a complete 14 system review of symptoms, the patient complains only of the following symptoms, and all other reviewed systems are negative.  See history of present illness  ALLERGIES: Allergies  Allergen Reactions  . Minocycline Itching    hives    HOME MEDICATIONS: Outpatient Prescriptions Prior to Visit  Medication Sig Dispense Refill  . ALPRAZolam (XANAX) 0.25 MG tablet Take 1 tablet (0.25 mg total) by mouth at bedtime as needed. 30 tablet 0  . b complex vitamins tablet Take 1 tablet by mouth daily.    . cholecalciferol (VITAMIN D) 1000 units tablet Take 1,000 Units by mouth daily.    . fexofenadine (ALLEGRA) 180 MG tablet Take 180 mg by mouth daily.      . fluticasone (FLONASE) 50 MCG/ACT nasal spray Place into both nostrils daily.    Marland Kitchen ibuprofen (ADVIL,MOTRIN) 800 MG tablet TAKE 1 TABLET (800 MG TOTAL) BY MOUTH EVERY 8 (EIGHT) HOURS AS NEEDED. 30 tablet 0  . MULTIPLE VITAMIN PO Take by mouth daily.     . Probiotic Product (PROBIOTIC PO) Take 1 tablet by mouth daily.    . SUMAtriptan (IMITREX) 25 MG tablet Take 1 tablet (25 mg total) by mouth once. As needed for Migraine, may repeat in 2 hours if headache persists or recurs. 10 tablet 0   No facility-administered medications prior to visit.    PAST MEDICAL HISTORY: Past Medical History  Diagnosis Date  . History of DVT (deep vein thrombosis)     2006   sp ankle fracture  immobilization   . Fracture     left ankle  . Allergic rhinitis   . Increased severity of headaches   . Environmental and  seasonal allergies     Ragweed, grasses, dust mite, mold  . Bilateral carpal tunnel syndrome 02/26/2016    PAST SURGICAL HISTORY: Past Surgical History  Procedure Laterality Date  . Tooth extraction    . L ankle frac Left 2015  . Wrist fracture surgery Right 2015    Dr. Amedeo Plenty    FAMILY HISTORY: Family History  Problem Relation Age of Onset  . Migraines Mother   . COPD Father     smoker  . Parkinsonism Father     SOCIAL HISTORY: Social History   Social History  . Marital Status: Married    Spouse Name: [name]  . Number of Children: 0  . Years of Education: College   Occupational History  .     Social History Main Topics  . Smoking status: Former Research scientist (life sciences)  . Smokeless tobacco: Never  Used     Comment: Quit in 2004  . Alcohol Use: 3.6 oz/week    6 Glasses of wine per week     Comment: social  . Drug Use: No  . Sexual Activity: Not on file   Other Topics Concern  . Not on file   Social History Narrative   Patient is married [name])   Patient works at [employer].   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   Married  HHof 2       Regular exercise-no   Vegan soy milk     neg tobacco 1-2 tea    8 hours sleep    Patient is right-handed.   Patient drinks 2-3 cups of coffee daily.   Patient has a college education.      PHYSICAL EXAM  Filed Vitals:   02/27/16 0943  BP: 118/70  Pulse: 67  Height: 5\' 5"  (1.651 m)  Weight: 166 lb (75.297 kg)   Body mass index is 27.62 kg/(m^2).  Generalized: Well developed, in no acute distress   Neurological examination  Mentation: Alert oriented to time, place, history taking. Follows all commands speech and language fluent Cranial nerve II-XII: Pupils were equal round reactive to light. Extraocular movements were full, visual field were full on confrontational test. Facial sensation and strength were normal. Uvula tongue midline. Head turning and shoulder  shrug  were normal and symmetric. Motor: The motor testing reveals 5 over 5 strength of all 4 extremities. Good symmetric motor tone is noted throughout.  Sensory: Sensory testing is intact to soft touch on all 4 extremities. No evidence of extinction is noted.  Coordination: Cerebellar testing reveals good finger-nose-finger and heel-to-shin bilaterally.  Gait and station: Gait is normal. Tandem gait is normal. Romberg is negative. No drift is seen.  Reflexes: Deep tendon reflexes are symmetric and normal bilaterally.   DIAGNOSTIC DATA (LABS, IMAGING, TESTING) - I reviewed patient records, labs, notes, testing and imaging myself where available.    ASSESSMENT AND PLAN 50 y.o. year old female  has a past medical history of History of DVT (deep vein thrombosis); Fracture; Allergic rhinitis; Increased severity of headaches; Environmental and seasonal allergies; and Bilateral carpal tunnel syndrome (02/26/2016). here with:  1. Carpal tunnel syndrome  Overall the patient is doing well. For now she will also continue using wrist splints. In the future she may consider surgical options. She will follow-up in 6 months with Dr. Brett Fairy.    Ward Givens, MSN, NP-C 02/27/2016, 10:10 AM [facility] [address], [place], [place] [address] [phone]

## 2016-02-27 NOTE — Patient Instructions (Signed)
Use wrist splints for carpal tunnel If you decide to proceed with surgical option let us know. If your symptoms worsen or you develop new symptoms please let us know.

## 2016-03-02 ENCOUNTER — Telehealth: Payer: Self-pay

## 2016-03-02 NOTE — Telephone Encounter (Signed)
-----   Message from Larey Seat, MD sent at 02/28/2016 11:04 AM EDT ----- Nerve conduction studies done on both upper extremities shows  evidence of mild bilateral carpal tunnel syndrome. EMG evaluation  of the left upper extremity was limited secondary to patient  refusal, the limited study was unremarkable.

## 2016-03-02 NOTE — Telephone Encounter (Signed)
I spoke to pt and advised her that Dr. Brett Fairy recommends wrist splints as first treatment for bilateral carpal tunnel, but if after 4 weeks, she does not notice improvement, to please call us and we can refer her for surgery to Dr. Fredna Dow. Pt verbalized understanding.

## 2016-03-02 NOTE — Telephone Encounter (Signed)
I spoke to pt and advised her that her NCV/EMG revealed mild bilateral carpal tunnel syndrome. Pt verbalized understanding.   Pt is asking if Dr. Brett Fairy recommends any other treatment than wrist splints and possible surgery. If surgery, please recommend a surgeon.

## 2016-03-02 NOTE — Telephone Encounter (Signed)
Wrist splints are first step, if no success after 4 weeks, will send to Dr Fredna Dow for surgery. CD

## 2016-04-17 ENCOUNTER — Other Ambulatory Visit (INDEPENDENT_AMBULATORY_CARE_PROVIDER_SITE_OTHER): Payer: BLUE CROSS/BLUE SHIELD

## 2016-04-17 DIAGNOSIS — Z0000 Encounter for general adult medical examination without abnormal findings: Secondary | ICD-10-CM

## 2016-04-17 LAB — CBC WITH DIFFERENTIAL/PLATELET
BASOS PCT: 0.3 % (ref 0.0–3.0)
Basophils Absolute: 0 10*3/uL (ref 0.0–0.1)
EOS PCT: 5.7 % — AB (ref 0.0–5.0)
Eosinophils Absolute: 0.4 10*3/uL (ref 0.0–0.7)
HEMATOCRIT: 43.1 % (ref 36.0–46.0)
HEMOGLOBIN: 14.9 g/dL (ref 12.0–15.0)
LYMPHS PCT: 21.6 % (ref 12.0–46.0)
Lymphs Abs: 1.4 10*3/uL (ref 0.7–4.0)
MCHC: 34.5 g/dL (ref 30.0–36.0)
MCV: 89.7 fl (ref 78.0–100.0)
MONOS PCT: 8.9 % (ref 3.0–12.0)
Monocytes Absolute: 0.6 10*3/uL (ref 0.1–1.0)
Neutro Abs: 4.2 10*3/uL (ref 1.4–7.7)
Neutrophils Relative %: 63.5 % (ref 43.0–77.0)
Platelets: 413 10*3/uL — ABNORMAL HIGH (ref 150.0–400.0)
RBC: 4.8 Mil/uL (ref 3.87–5.11)
RDW: 13.9 % (ref 11.5–15.5)
WBC: 6.6 10*3/uL (ref 4.0–10.5)

## 2016-04-17 LAB — TSH: TSH: 1.96 u[IU]/mL (ref 0.35–4.50)

## 2016-04-17 LAB — LIPID PANEL
CHOL/HDL RATIO: 4
Cholesterol: 200 mg/dL (ref 0–200)
HDL: 51.5 mg/dL (ref >39.00)
LDL Cholesterol: 130 mg/dL — ABNORMAL HIGH (ref 0–99)
NonHDL: 148.29
Triglycerides: 90 mg/dL (ref 0.0–149.0)
VLDL: 18 mg/dL (ref 0.0–40.0)

## 2016-04-17 LAB — BASIC METABOLIC PANEL
BUN: 14 mg/dL (ref 6–23)
CHLORIDE: 104 meq/L (ref 96–112)
CO2: 27 mEq/L (ref 19–32)
Calcium: 9 mg/dL (ref 8.4–10.5)
Creatinine, Ser: 0.64 mg/dL (ref 0.40–1.20)
GFR: 105.36 mL/min (ref >60.00)
Glucose, Bld: 100 mg/dL — ABNORMAL HIGH (ref 70–99)
POTASSIUM: 4.2 meq/L (ref 3.5–5.1)
SODIUM: 138 meq/L (ref 135–145)

## 2016-04-17 LAB — HEPATIC FUNCTION PANEL
ALT: 17 U/L (ref 0–35)
AST: 18 U/L (ref 0–37)
Albumin: 4.3 g/dL (ref 3.5–5.2)
Alkaline Phosphatase: 31 U/L — ABNORMAL LOW (ref 39–117)
BILIRUBIN DIRECT: 0.1 mg/dL (ref 0.0–0.3)
TOTAL PROTEIN: 7.2 g/dL (ref 6.0–8.3)
Total Bilirubin: 0.5 mg/dL (ref 0.2–1.2)

## 2016-04-28 NOTE — Progress Notes (Signed)
Pre visit review using our clinic review tool, if applicable. No additional management support is needed unless otherwise documented below in the visit note.  Chief Complaint  Patient presents with  . Annual Exam    HPI: Patient  Kendra Fox  50 y.o. comes in today for [facility] visit  She's also had significant sore throat for 1 day without associated fever does hurt to swallow there is no cold or congestion. She's also had what she describes as diarrhea loose stools off and on for a week. No fever abdominal pain or blood. Taking vit d 2000 ? Per day hx of low vit d .    Health Maintenance  Topic Date Due  . TETANUS/TDAP  08/10/2014  . PAP SMEAR  11/09/2015  . INFLUENZA VACCINE  03/10/2016  . 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     ROS:  GEN/ HEENT: No fever, significant weight changes sweats headaches vision problems hearing changes, CV/ PULM; No chest pain shortness of breath cough, syncope,edema  change in exercise tolerance. GI /GU: No adominal pain, vomiting, change in bowel habits. No blood in the stool. No significant GU symptoms. SKIN/HEME: ,no acute skin rashes suspicious lesions or bleeding. No lymphadenopathy, nodules, masses.  NEURO/ PSYCH:  No neurologic signs such as weakness numbness. No depression anxiety. IMM/ Allergy: No unusual infections.  Allergy .   REST of 12 system review negative except as per HPI   Past Medical History:  Diagnosis Date  . Allergic rhinitis   . Bilateral carpal tunnel syndrome 02/26/2016  . Environmental and seasonal allergies    Ragweed, grasses, dust mite, mold  . Fracture    left ankle  .  History of DVT (deep vein thrombosis)    2006   sp ankle fracture  immobilization   . Increased severity of headaches     Past Surgical History:  Procedure Laterality Date  . L ankle frac Left 2015  . TOOTH EXTRACTION    . WRIST FRACTURE SURGERY Right 2015   Dr. Amedeo Plenty    Family History  Problem Relation Age of Onset  . Migraines Mother   . COPD Father     smoker  . Parkinsonism Father     Social History   Social History  . Marital status: Married    Spouse name: [name]  . Number of children: 0  . Years of education: College   Occupational History  .  [employer]   Social History Main Topics  . Smoking status: Former Research scientist (life sciences)  . Smokeless tobacco: Never Used     Comment: Quit in 2004  . Alcohol use 3.6 oz/week    6 Glasses of wine per week     Comment: social  . Drug use: No  . Sexual activity: Not Asked   Other Topics Concern  . None   Social History Narrative   Patient is married [name])   Patient works at [employer].   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   Married  HHof 2       Regular exercise-no   Vegan soy milk  neg tobacco 1-2 tea    8 hours sleep    Patient is right-handed.   Patient drinks 2-3 cups of coffee daily.   Patient has a college education.    Outpatient Medications Prior to Visit  Medication Sig Dispense Refill  . ALPRAZolam (XANAX) 0.25 MG tablet Take 1 tablet (0.25 mg total) by mouth at bedtime as needed. 30 tablet 0  . ampicillin (PRINCIPEN) 500 MG capsule 500 mg. Reported on 02/27/2016  0  . b complex vitamins tablet Take 1 tablet by mouth daily.    . cholecalciferol (VITAMIN D) 1000 units tablet Take 1,000 Units by mouth daily.    . fluticasone (FLONASE) 50 MCG/ACT nasal spray Place into both nostrils daily.    Marland Kitchen ibuprofen (ADVIL,MOTRIN) 800 MG tablet TAKE 1 TABLET (800 MG TOTAL) BY MOUTH EVERY 8 (EIGHT) HOURS AS NEEDED. 30 tablet 0  . MULTIPLE VITAMIN  PO Take by mouth daily.     . Probiotic Product (PROBIOTIC PO) Take 1 tablet by mouth daily.    . SUMAtriptan (IMITREX) 25 MG tablet Take 1 tablet (25 mg total) by mouth once. As needed for Migraine, may repeat in 2 hours if headache persists or recurs. 10 tablet 0  . fexofenadine (ALLEGRA) 180 MG tablet Take 180 mg by mouth daily.       No facility-administered medications prior to visit.      EXAM:  BP 120/80   Temp 98.9 F (37.2 C) (Oral)   Ht '5\' 5"'  (1.651 m)   Wt 164 lb 4.8 oz (74.5 kg)   LMP 04/22/2016   BMI 27.34 kg/m   Body mass index is 27.34 kg/m.  Physical Exam: Vital signs reviewed RAQ:TMAU is a well-developed well-nourished alert cooperative    who appearsr stated age in no acute distress.  HEENT: normocephalic atraumatic , Eyes: PERRL EOM's full, conjunctiva clear, Nares: paten,t no deformity discharge or tenderness., Ears: no deformity EAC's clear TMs with normal landmarks. Mouth: clear OP, no lesions, edema. 2+ red  Moist mucous membranes. Dentition in adequate repair. NECK: supple without masses, thyromegaly or bruits.? tedner ac  No congestion  CHEST/PULM:  Clear to auscultation and percussion breath sounds equal no wheeze , rales or rhonchi. No chest wall deformities or tenderness.Breast: normal by inspection . No dimpling, discharge, masses, tenderness or discharge . CV: PMI is nondisplaced, S1 S2 no gallops, murmurs, rubs. Peripheral pulses are full without delay.No JVD .  ABDOMEN: Bowel sounds normal nontender  No guard or rebound, no hepato splenomegal no CVA tenderness.  No hernia. Extremtities:  No clubbing cyanosis or edema, no acute joint swelling or redness no focal atrophy NEURO:  Oriented x3, cranial nerves 3-12 appear to be intact, no obvious focal weakness,gait within normal limits no abnormal reflexes or asymmetrical SKIN: No acute rashes normal turgor, color, no bruising or petechiae.healing cat scratches  PSYCH: Oriented, good eye contact, no  obvious depression anxiety, cognition and judgment appear normal. LN: no cervical axillary inguinal adenopathy  Lab Results  Component Value Date   WBC 6.6 04/17/2016   HGB 14.9 04/17/2016   HCT 43.1 04/17/2016   PLT 413.0 (H) 04/17/2016   GLUCOSE 100 (H) 04/17/2016   CHOL 200 04/17/2016   TRIG 90.0 04/17/2016   HDL 51.50 04/17/2016   LDLDIRECT 121.0 04/25/2013   LDLCALC 130 (H) 04/17/2016   ALT 17 04/17/2016   AST 18 04/17/2016   NA 138 04/17/2016   K 4.2 04/17/2016   CL 104 04/17/2016   CREATININE 0.64  04/17/2016   BUN 14 04/17/2016   CO2 27 04/17/2016   TSH 1.96 04/17/2016   Lab reveiwed  ASSESSMENT AND PLAN:  Discussed the following assessment and plan:  Visit for preventive health examination  Acute pharyngitis, unspecified etiology - Plan: POCT rapid strep A, Culture, Group A Strep  Diarrhea, unspecified type  Vitamin D deficiency - cont otc  get level D at next blood tests  has low alk phos also   Patient Care Team: Burnis Medin, MD as PCP - General Marylynn Pearson, MD as Consulting Physician (Obstetrics and Gynecology) Danella Sensing, MD as Consulting Physician (Dermatology) Roseanne Kaufman, MD as Consulting Physician (Orthopedic Surgery) Larey Seat, MD as Consulting Physician (Neurology) Patient Instructions  Sore throat probably a viral infection that needs to run its courses will let y ou know culture results . cpx in a year  Stay on vit d for now  Can do labs at the visit and check vit d at the next visit labs  Come back for injection only for tdap and flu vaccine when  Improving .    Health Maintenance, Female Adopting a healthy lifestyle and getting preventive care can go a long way to promote health and wellness. Talk with your health care provider about what schedule of regular examinations is right for you. This is a good chance for you to check in with your provider about disease prevention and staying healthy. In between checkups, there are  plenty of things you can do on your own. Experts have done a lot of research about which lifestyle changes and preventive measures are most likely to keep you healthy. Ask your health care provider for more information. WEIGHT AND DIET  Eat a healthy diet  Be sure to include plenty of vegetables, fruits, low-fat dairy products, and lean protein.  Do not eat a lot of foods high in solid fats, added sugars, or salt.  Get regular exercise. This is one of the most important things you can do for your health.  Most adults should exercise for at least 150 minutes each week. The exercise should increase your heart rate and make you sweat (moderate-intensity exercise).  Most adults should also do strengthening exercises at least twice a week. This is in addition to the moderate-intensity exercise.  Maintain a healthy weight  Body mass index (BMI) is a measurement that can be used to identify possible weight problems. It estimates body fat based on height and weight. Your health care provider can help determine your BMI and help you achieve or maintain a healthy weight.  For females 41 years of age and older:   A BMI below 18.5 is considered underweight.  A BMI of 18.5 to 24.9 is normal.  A BMI of 25 to 29.9 is considered overweight.  A BMI of 30 and above is considered obese.  Watch levels of cholesterol and blood lipids  You should start having your blood tested for lipids and cholesterol at 48 years of age, then have this test every 5 years.  You may need to have your cholesterol levels checked more often if:  Your lipid or cholesterol levels are high.  You are older than 48 years of age.  You are at high risk for heart disease.  CANCER SCREENING   Lung Cancer  Lung cancer screening is recommended for adults 10-43 years old who are at high risk for lung cancer because of a history of smoking.  A yearly low-dose CT scan of the lungs  is recommended for people who:  Currently  smoke.  Have quit within the past 15 years.  Have at least a 30-pack-year history of smoking. A pack year is smoking an average of one pack of cigarettes a day for 1 year.  Yearly screening should continue until it has been 15 years since you quit.  Yearly screening should stop if you develop a health problem that would prevent you from having lung cancer treatment.  Breast Cancer  Practice breast self-awareness. This means understanding how your breasts normally appear and feel.  It also means doing regular breast self-exams. Let your health care provider know about any changes, no matter how small.  If you are in your 20s or 30s, you should have a clinical breast exam (CBE) by a health care provider every 1-3 years as part of a regular health exam.  If you are 51 or older, have a CBE every year. Also consider having a breast X-ray (mammogram) every year.  If you have a family history of breast cancer, talk to your health care provider about genetic screening.  If you are at high risk for breast cancer, talk to your health care provider about having an MRI and a mammogram every year.  Breast cancer gene (BRCA) assessment is recommended for women who have family members with BRCA-related cancers. BRCA-related cancers include:  Breast.  Ovarian.  Tubal.  Peritoneal cancers.  Results of the assessment will determine the need for genetic counseling and BRCA1 and BRCA2 testing. Cervical Cancer Your health care provider may recommend that you be screened regularly for cancer of the pelvic organs (ovaries, uterus, and vagina). This screening involves a pelvic examination, including checking for microscopic changes to the surface of your cervix (Pap test). You may be encouraged to have this screening done every 3 years, beginning at age 52.  For women ages 3-65, health care providers may recommend pelvic exams and Pap testing every 3 years, or they may recommend the Pap and pelvic  exam, combined with testing for human papilloma virus (HPV), every 5 years. Some types of HPV increase your risk of cervical cancer. Testing for HPV may also be done on women of any age with unclear Pap test results.  Other health care providers may not recommend any screening for nonpregnant women who are considered low risk for pelvic cancer and who do not have symptoms. Ask your health care provider if a screening pelvic exam is right for you.  If you have had past treatment for cervical cancer or a condition that could lead to cancer, you need Pap tests and screening for cancer for at least 20 years after your treatment. If Pap tests have been discontinued, your risk factors (such as having a new sexual partner) need to be reassessed to determine if screening should resume. Some women have medical problems that increase the chance of getting cervical cancer. In these cases, your health care provider may recommend more frequent screening and Pap tests. Colorectal Cancer  This type of cancer can be detected and often prevented.  Routine colorectal cancer screening usually begins at 48 years of age and continues through 48 years of age.  Your health care provider may recommend screening at an earlier age if you have risk factors for colon cancer.  Your health care provider may also recommend using home test kits to check for hidden blood in the stool.  A small camera at the end of a tube can be used to examine your colon  directly (sigmoidoscopy or colonoscopy). This is done to check for the earliest forms of colorectal cancer.  Routine screening usually begins at age 67.  Direct examination of the colon should be repeated every 5-10 years through 48 years of age. However, you may need to be screened more often if early forms of precancerous polyps or small growths are found. Skin Cancer  Check your skin from head to toe regularly.  Tell your health care provider about any new moles or  changes in moles, especially if there is a change in a mole's shape or color.  Also tell your health care provider if you have a mole that is larger than the size of a pencil eraser.  Always use sunscreen. Apply sunscreen liberally and repeatedly throughout the day.  Protect yourself by wearing long sleeves, pants, a wide-brimmed hat, and sunglasses whenever you are outside. HEART DISEASE, DIABETES, AND HIGH BLOOD PRESSURE   High blood pressure causes heart disease and increases the risk of stroke. High blood pressure is more likely to develop in:  People who have blood pressure in the high end of the normal range (130-139/85-89 mm Hg).  People who are overweight or obese.  People who are African American.  If you are 58-14 years of age, have your blood pressure checked every 3-5 years. If you are 57 years of age or older, have your blood pressure checked every year. You should have your blood pressure measured twice--once when you are at a hospital or clinic, and once when you are not at a hospital or clinic. Record the average of the two measurements. To check your blood pressure when you are not at a hospital or clinic, you can use:  An automated blood pressure machine at a pharmacy.  A home blood pressure monitor.  If you are between 5 years and 81 years old, ask your health care provider if you should take aspirin to prevent strokes.  Have regular diabetes screenings. This involves taking a blood sample to check your fasting blood sugar level.  If you are at a normal weight and have a low risk for diabetes, have this test once every three years after 48 years of age.  If you are overweight and have a high risk for diabetes, consider being tested at a younger age or more often. PREVENTING INFECTION  Hepatitis B  If you have a higher risk for hepatitis B, you should be screened for this virus. You are considered at high risk for hepatitis B if:  You were born in a country where  hepatitis B is common. Ask your health care provider which countries are considered high risk.  Your parents were born in a high-risk country, and you have not been immunized against hepatitis B (hepatitis B vaccine).  You have HIV or AIDS.  You use needles to inject street drugs.  You live with someone who has hepatitis B.  You have had sex with someone who has hepatitis B.  You get hemodialysis treatment.  You take certain medicines for conditions, including cancer, organ transplantation, and autoimmune conditions. Hepatitis C  Blood testing is recommended for:  Everyone born from [birth year] through 1963.  Anyone with known risk factors for hepatitis C. Sexually transmitted infections (STIs)  You should be screened for sexually transmitted infections (STIs) including gonorrhea and chlamydia if:  You are sexually active and are younger than 48 years of age.  You are older than 48 years of age and your health care provider tells  you that you are at risk for this type of infection.  Your sexual activity has changed since you were last screened and you are at an increased risk for chlamydia or gonorrhea. Ask your health care provider if you are at risk.  If you do not have HIV, but are at risk, it may be recommended that you take a prescription medicine daily to prevent HIV infection. This is called pre-exposure prophylaxis (PrEP). You are considered at risk if:  You are sexually active and do not regularly use condoms or know the HIV status of your partner(s).  You take drugs by injection.  You are sexually active with a partner who has HIV. Talk with your health care provider about whether you are at high risk of being infected with HIV. If you choose to begin PrEP, you should first be tested for HIV. You should then be tested every 3 months for as long as you are taking PrEP.  PREGNANCY   If you are premenopausal and you may become pregnant, ask your health care provider about  preconception counseling.  If you may become pregnant, take 400 to 800 micrograms (mcg) of folic acid every day.  If you want to prevent pregnancy, talk to your health care provider about birth control (contraception). OSTEOPOROSIS AND MENOPAUSE   Osteoporosis is a disease in which the bones lose minerals and strength with aging. This can result in serious bone fractures. Your risk for osteoporosis can be identified using a bone density scan.  If you are 50 years of age or older, or if you are at risk for osteoporosis and fractures, ask your health care provider if you should be screened.  Ask your health care provider whether you should take a calcium or vitamin D supplement to lower your risk for osteoporosis.  Menopause may have certain physical symptoms and risks.  Hormone replacement therapy may reduce some of these symptoms and risks. Talk to your health care provider about whether hormone replacement therapy is right for you.  HOME CARE INSTRUCTIONS   Schedule regular health, dental, and eye exams.  Stay current with your immunizations.   Do not use any tobacco products including cigarettes, chewing tobacco, or electronic cigarettes.  If you are pregnant, do not drink alcohol.  If you are breastfeeding, limit how much and how often you drink alcohol.  Limit alcohol intake to no more than 1 drink per day for nonpregnant women. One drink equals 12 ounces of beer, 5 ounces of wine, or 1 ounces of hard liquor.  Do not use street drugs.  Do not share needles.  Ask your health care provider for help if you need support or information about quitting drugs.  Tell your health care provider if you often feel depressed.  Tell your health care provider if you have ever been abused or do not feel safe at home.   This information is not intended to replace advice given to you by your health care provider. Make sure you discuss any questions you have with your health care  provider.   Document Released: 02/09/2011 Document Revised: 08/17/2014 Document Reviewed: 06/28/2013 Elsevier Interactive Patient Education 2016 [place] K. Wilbern Pennypacker M.D.

## 2016-04-29 ENCOUNTER — Encounter: Payer: Self-pay | Admitting: Internal Medicine

## 2016-04-29 ENCOUNTER — Ambulatory Visit (INDEPENDENT_AMBULATORY_CARE_PROVIDER_SITE_OTHER): Payer: BLUE CROSS/BLUE SHIELD | Admitting: Internal Medicine

## 2016-04-29 VITALS — BP 120/80 | Temp 98.9°F | Ht 65.0 in | Wt 164.3 lb

## 2016-04-29 DIAGNOSIS — J029 Acute pharyngitis, unspecified: Secondary | ICD-10-CM

## 2016-04-29 DIAGNOSIS — R197 Diarrhea, unspecified: Secondary | ICD-10-CM | POA: Diagnosis not present

## 2016-04-29 DIAGNOSIS — Z0001 Encounter for general adult medical examination with abnormal findings: Secondary | ICD-10-CM

## 2016-04-29 DIAGNOSIS — E559 Vitamin D deficiency, unspecified: Secondary | ICD-10-CM

## 2016-04-29 DIAGNOSIS — Z0000 Encounter for general adult medical examination without abnormal findings: Secondary | ICD-10-CM

## 2016-04-29 LAB — POCT RAPID STREP A (OFFICE): Rapid Strep A Screen: NEGATIVE

## 2016-04-29 NOTE — Patient Instructions (Addendum)
Sore throat probably a viral infection that needs to run its courses will let y ou know culture results . cpx in a year  Stay on vit d for now  Can do labs at the visit and check vit d at the next visit labs  Come back for injection only for tdap and flu vaccine when  Improving .    Health Maintenance, Female Adopting a healthy lifestyle and getting preventive care can go a long way to promote health and wellness. Talk with your health care provider about what schedule of regular examinations is right for you. This is a good chance for you to check in with your provider about disease prevention and staying healthy. In between checkups, there are plenty of things you can do on your own. Experts have done a lot of research about which lifestyle changes and preventive measures are most likely to keep you healthy. Ask your health care provider for more information. WEIGHT AND DIET  Eat a healthy diet  Be sure to include plenty of vegetables, fruits, low-fat dairy products, and lean protein.  Do not eat a lot of foods high in solid fats, added sugars, or salt.  Get regular exercise. This is one of the most important things you can do for your health.  Most adults should exercise for at least 150 minutes each week. The exercise should increase your heart rate and make you sweat (moderate-intensity exercise).  Most adults should also do strengthening exercises at least twice a week. This is in addition to the moderate-intensity exercise.  Maintain a healthy weight  Body mass index (BMI) is a measurement that can be used to identify possible weight problems. It estimates body fat based on height and weight. Your health care provider can help determine your BMI and help you achieve or maintain a healthy weight.  For females 58 years of age and older:   A BMI below 18.5 is considered underweight.  A BMI of 18.5 to 24.9 is normal.  A BMI of 25 to 29.9 is considered overweight.  A BMI of 30  and above is considered obese.  Watch levels of cholesterol and blood lipids  You should start having your blood tested for lipids and cholesterol at 48 years of age, then have this test every 5 years.  You may need to have your cholesterol levels checked more often if:  Your lipid or cholesterol levels are high.  You are older than 48 years of age.  You are at high risk for heart disease.  CANCER SCREENING   Lung Cancer  Lung cancer screening is recommended for adults 85-78 years old who are at high risk for lung cancer because of a history of smoking.  A yearly low-dose CT scan of the lungs is recommended for people who:  Currently smoke.  Have quit within the past 15 years.  Have at least a 30-pack-year history of smoking. A pack year is smoking an average of one pack of cigarettes a day for 1 year.  Yearly screening should continue until it has been 15 years since you quit.  Yearly screening should stop if you develop a health problem that would prevent you from having lung cancer treatment.  Breast Cancer  Practice breast self-awareness. This means understanding how your breasts normally appear and feel.  It also means doing regular breast self-exams. Let your health care provider know about any changes, no matter how small.  If you are in your 20s or 30s, you  should have a clinical breast exam (CBE) by a health care provider every 1-3 years as part of a regular health exam.  If you are 23 or older, have a CBE every year. Also consider having a breast X-ray (mammogram) every year.  If you have a family history of breast cancer, talk to your health care provider about genetic screening.  If you are at high risk for breast cancer, talk to your health care provider about having an MRI and a mammogram every year.  Breast cancer gene (BRCA) assessment is recommended for women who have family members with BRCA-related cancers. BRCA-related cancers  include:  Breast.  Ovarian.  Tubal.  Peritoneal cancers.  Results of the assessment will determine the need for genetic counseling and BRCA1 and BRCA2 testing. Cervical Cancer Your health care provider may recommend that you be screened regularly for cancer of the pelvic organs (ovaries, uterus, and vagina). This screening involves a pelvic examination, including checking for microscopic changes to the surface of your cervix (Pap test). You may be encouraged to have this screening done every 3 years, beginning at age 36.  For women ages 61-65, health care providers may recommend pelvic exams and Pap testing every 3 years, or they may recommend the Pap and pelvic exam, combined with testing for human papilloma virus (HPV), every 5 years. Some types of HPV increase your risk of cervical cancer. Testing for HPV may also be done on women of any age with unclear Pap test results.  Other health care providers may not recommend any screening for nonpregnant women who are considered low risk for pelvic cancer and who do not have symptoms. Ask your health care provider if a screening pelvic exam is right for you.  If you have had past treatment for cervical cancer or a condition that could lead to cancer, you need Pap tests and screening for cancer for at least 20 years after your treatment. If Pap tests have been discontinued, your risk factors (such as having a new sexual partner) need to be reassessed to determine if screening should resume. Some women have medical problems that increase the chance of getting cervical cancer. In these cases, your health care provider may recommend more frequent screening and Pap tests. Colorectal Cancer  This type of cancer can be detected and often prevented.  Routine colorectal cancer screening usually begins at 48 years of age and continues through 48 years of age.  Your health care provider may recommend screening at an earlier age if you have risk factors for  colon cancer.  Your health care provider may also recommend using home test kits to check for hidden blood in the stool.  A small camera at the end of a tube can be used to examine your colon directly (sigmoidoscopy or colonoscopy). This is done to check for the earliest forms of colorectal cancer.  Routine screening usually begins at age 33.  Direct examination of the colon should be repeated every 5-10 years through 48 years of age. However, you may need to be screened more often if early forms of precancerous polyps or small growths are found. Skin Cancer  Check your skin from head to toe regularly.  Tell your health care provider about any new moles or changes in moles, especially if there is a change in a mole's shape or color.  Also tell your health care provider if you have a mole that is larger than the size of a pencil eraser.  Always use sunscreen.  Apply sunscreen liberally and repeatedly throughout the day.  Protect yourself by wearing long sleeves, pants, a wide-brimmed hat, and sunglasses whenever you are outside. HEART DISEASE, DIABETES, AND HIGH BLOOD PRESSURE   High blood pressure causes heart disease and increases the risk of stroke. High blood pressure is more likely to develop in:  People who have blood pressure in the high end of the normal range (130-139/85-89 mm Hg).  People who are overweight or obese.  People who are African American.  If you are 59-61 years of age, have your blood pressure checked every 3-5 years. If you are 27 years of age or older, have your blood pressure checked every year. You should have your blood pressure measured twice--once when you are at a hospital or clinic, and once when you are not at a hospital or clinic. Record the average of the two measurements. To check your blood pressure when you are not at a hospital or clinic, you can use:  An automated blood pressure machine at a pharmacy.  A home blood pressure monitor.  If you  are between 22 years and 26 years old, ask your health care provider if you should take aspirin to prevent strokes.  Have regular diabetes screenings. This involves taking a blood sample to check your fasting blood sugar level.  If you are at a normal weight and have a low risk for diabetes, have this test once every three years after 48 years of age.  If you are overweight and have a high risk for diabetes, consider being tested at a younger age or more often. PREVENTING INFECTION  Hepatitis B  If you have a higher risk for hepatitis B, you should be screened for this virus. You are considered at high risk for hepatitis B if:  You were born in a country where hepatitis B is common. Ask your health care provider which countries are considered high risk.  Your parents were born in a high-risk country, and you have not been immunized against hepatitis B (hepatitis B vaccine).  You have HIV or AIDS.  You use needles to inject street drugs.  You live with someone who has hepatitis B.  You have had sex with someone who has hepatitis B.  You get hemodialysis treatment.  You take certain medicines for conditions, including cancer, organ transplantation, and autoimmune conditions. Hepatitis C  Blood testing is recommended for:  Everyone born from [birth year] through 1963.  Anyone with known risk factors for hepatitis C. Sexually transmitted infections (STIs)  You should be screened for sexually transmitted infections (STIs) including gonorrhea and chlamydia if:  You are sexually active and are younger than 48 years of age.  You are older than 48 years of age and your health care provider tells you that you are at risk for this type of infection.  Your sexual activity has changed since you were last screened and you are at an increased risk for chlamydia or gonorrhea. Ask your health care provider if you are at risk.  If you do not have HIV, but are at risk, it may be recommended that you  take a prescription medicine daily to prevent HIV infection. This is called pre-exposure prophylaxis (PrEP). You are considered at risk if:  You are sexually active and do not regularly use condoms or know the HIV status of your partner(s).  You take drugs by injection.  You are sexually active with a partner who has HIV. Talk with your health care provider about  whether you are at high risk of being infected with HIV. If you choose to begin PrEP, you should first be tested for HIV. You should then be tested every 3 months for as long as you are taking PrEP.  PREGNANCY   If you are premenopausal and you may become pregnant, ask your health care provider about preconception counseling.  If you may become pregnant, take 400 to 800 micrograms (mcg) of folic acid every day.  If you want to prevent pregnancy, talk to your health care provider about birth control (contraception). OSTEOPOROSIS AND MENOPAUSE   Osteoporosis is a disease in which the bones lose minerals and strength with aging. This can result in serious bone fractures. Your risk for osteoporosis can be identified using a bone density scan.  If you are 38 years of age or older, or if you are at risk for osteoporosis and fractures, ask your health care provider if you should be screened.  Ask your health care provider whether you should take a calcium or vitamin D supplement to lower your risk for osteoporosis.  Menopause may have certain physical symptoms and risks.  Hormone replacement therapy may reduce some of these symptoms and risks. Talk to your health care provider about whether hormone replacement therapy is right for you.  HOME CARE INSTRUCTIONS   Schedule regular health, dental, and eye exams.  Stay current with your immunizations.   Do not use any tobacco products including cigarettes, chewing tobacco, or electronic cigarettes.  If you are pregnant, do not drink alcohol.  If you are breastfeeding, limit how  much and how often you drink alcohol.  Limit alcohol intake to no more than 1 drink per day for nonpregnant women. One drink equals 12 ounces of beer, 5 ounces of wine, or 1 ounces of hard liquor.  Do not use street drugs.  Do not share needles.  Ask your health care provider for help if you need support or information about quitting drugs.  Tell your health care provider if you often feel depressed.  Tell your health care provider if you have ever been abused or do not feel safe at home.   This information is not intended to replace advice given to you by your health care provider. Make sure you discuss any questions you have with your health care provider.   Document Released: 02/09/2011 Document Revised: 08/17/2014 Document Reviewed: 06/28/2013 Elsevier Interactive Patient Education [employer].

## 2016-05-01 LAB — CULTURE, GROUP A STREP: Organism ID, Bacteria: NORMAL

## 2016-05-30 ENCOUNTER — Other Ambulatory Visit: Payer: Self-pay | Admitting: Internal Medicine

## 2016-09-01 ENCOUNTER — Encounter: Payer: Self-pay | Admitting: Neurology

## 2016-09-01 ENCOUNTER — Ambulatory Visit (INDEPENDENT_AMBULATORY_CARE_PROVIDER_SITE_OTHER): Payer: BLUE CROSS/BLUE SHIELD | Admitting: Neurology

## 2016-09-01 VITALS — BP 100/70 | HR 80 | Resp 20 | Ht 65.5 in | Wt 167.0 lb

## 2016-09-01 DIAGNOSIS — G5603 Carpal tunnel syndrome, bilateral upper limbs: Secondary | ICD-10-CM

## 2016-09-01 DIAGNOSIS — R519 Headache, unspecified: Secondary | ICD-10-CM

## 2016-09-01 DIAGNOSIS — G43009 Migraine without aura, not intractable, without status migrainosus: Secondary | ICD-10-CM | POA: Diagnosis not present

## 2016-09-01 DIAGNOSIS — M26609 Unspecified temporomandibular joint disorder, unspecified side: Secondary | ICD-10-CM | POA: Diagnosis not present

## 2016-09-01 DIAGNOSIS — R51 Headache: Secondary | ICD-10-CM | POA: Diagnosis not present

## 2016-09-01 MED ORDER — SUMATRIPTAN SUCCINATE 25 MG PO TABS: 25.0000 mg | ORAL_TABLET | Freq: Once | ORAL | 0 refills | Status: DC

## 2016-09-01 NOTE — Progress Notes (Signed)
PATIENT: Kendra Fox DOB: Jan 26, 1966  REASON FOR VISIT: routine follow up for facial pain, headache HISTORY FROM: patient  HISTORY OF PRESENT ILLNESS:  09-01-2016, Kendra Fox is a 51 y.o. female Is seen here as a referral from Dr. Regis Bill , the patient follows up on a diagnosis of bilateral carpal tunnel syndrome established and an EMG and nerve conduction study on 02/26/2016 performed by Dr. Lenor Coffin. She feels that her wrist and hands have been less painful she does have less of the numbness unless she has intricate movements with both hands. For while she used braces to relieve pressure on the carpal tunnel and this helped especially while driving. Occasionally, she will still wake up with a numbness radiating from the shoulder to what the hands. She does not report clumsiness or dropping objects with both hands for either hand. She has also reported that her migraines are very well controlled, needs refills.   Interval history from 01/15/2016. Kendra Fox has been doing well with her TMJ pain and facial pain and continues to be on the same medication. She has a new concern of finger and hand numbness from the palmar aspect of the hand not affecting the dorsum. The numbness can last all day after she woke up with it. She has tried some massage therapy, attributing the numbness to a possible pinched nerve in her neck. Indeed she felt some relief after massage therapy. But the symptoms have returned since.   08-20-14 CD Kendra Fox is here for her regular the visit as scheduled by Kendra Fox during her last visit. She is a 51 year old female with an atypical facial pain and TMJ. [facility], and now at  [facility]- On her own account and costs. She gets relief by using massage therapy. Heat and cold have not been tried, she has used a muscle relaxer .I will change her today to elavil and see if this works better for her. She was advised  not to drive within 8 hours and not to drink alcohol with it. She has neither dry mouth nor dry eye.   UPDATE 01/22/14 (Kendra Fox):  Normal MRI of the brain, Dr. Brett Fairy suggested dental evaluation at a TMJ specialist, [facility] did not accept her insurance.  She went to [facility] and was evaluated and treated with massage and TENS.  Also going to [facility] for occasional massage.  She feels that she has improved, still has mild tension headaches at the end of the day and has had 1 migraine in the last month, relieved with Sumatriptan.  She stopped taking Zonisamide when she started feeling better.   11/15/13 PRIOR HPI (Kendra Fox):  Kendra Fox is a right-handed, Caucasian female that has been evaluated for what was present to be sinus headache. She has pain in the right maxilla- occasionally some nasal congestion- and was treated with an antibiotic, which only helped a little and only temporarily.  Last year she also had a similar spell but in autumn. At that time, she found relief with antibiotic treatment. She really doesn't have a history of prolonged sinusitis or sinus problems, she has no history of bony abnormalities or structural injuries to the facial skull or airway. She had noticed some discomfort in her right temporal mandibular joint and her dentist has assured her that there are no signs of bruxism when examining her 14 days ago. A CT of the sinus was obtained through Dr. Jenetta Loges, negative for sinusitis.  The patient is a nonsmoker -she quit smoking over 10 years ago,  she does not use alcohol and she uses caffeine in the morning only.  She had one tooth extraction surgery began nor surgery or trauma to the neck airway or facial skull. The pain is of a latent quality, comes and goes, doesn't wake her from sleep nor does she wake up and notices pain first. She hasn't noticed any triggers. One time she noted the pain coming on after exercise, and associated with a right visual aura of zig  -zag lines of light in the peripheral field. No nausea, no phonophobia, but photophobia, a slight dizziness, light headedness. There seems no trigger in activity, walking etc.  She has not lost ability to smell or taste. She describes a dull pressure sensation over the right Cheek bone.   Family history 08-20-14, Her mother has migraines.no history otherwise.  No personal history of concussion or contusion, TBI. Migraine:  rare.        Review of Systems:  Out of a complete 14 system review, the patient complains of only the following symptoms, and all other reviewed systems are negative.  Headache, TMJ pain. Sleep is not affected.   She has only used 2 imitrex since seen last ( 08-20-14). These work well when she has migraines.   ALLERGIES: Allergies  Allergen Reactions  . Minocycline Itching    hives    HOME MEDICATIONS: Outpatient Medications Prior to Visit  Medication Sig Dispense Refill  . ALPRAZolam (XANAX) 0.25 MG tablet Take 1 tablet (0.25 mg total) by mouth at bedtime as needed. 30 tablet 0  . ampicillin (PRINCIPEN) 500 MG capsule 500 mg. Reported on 02/27/2016  0  . b complex vitamins tablet Take 1 tablet by mouth daily.    . cholecalciferol (VITAMIN D) 1000 units tablet Take 1,000 Units by mouth daily.    . fluticasone (FLONASE) 50 MCG/ACT nasal spray Place into both nostrils daily.    Marland Kitchen ibuprofen (ADVIL,MOTRIN) 800 MG tablet TAKE 1 TABLET (800 MG TOTAL) BY MOUTH EVERY 8 (EIGHT) HOURS AS NEEDED. 30 tablet 0  . MULTIPLE VITAMIN PO Take by mouth daily.     . Probiotic Product (PROBIOTIC PO) Take 1 tablet by mouth daily.    . SUMAtriptan (IMITREX) 25 MG tablet Take 1 tablet (25 mg total) by mouth once. As needed for Migraine, may repeat in 2 hours if headache persists or recurs. 10 tablet 0  . loratadine (CLARITIN) 10 MG tablet Take 10 mg by mouth daily.     No facility-administered medications prior to visit.      PHYSICAL EXAM  Vitals:   09/01/16 0833  BP: 100/70    Pulse: 80  Resp: 20  Weight: 167 lb (75.8 kg)  Height: 5' 5.5" (1.664 m)   Body mass index is 27.37 kg/m. No exam data present  Generalized: Well developed, in no acute distress  Head: normocephalic and atraumatic. Oropharynx benign. Mallampati 2 neck circumference: 14.5, no temporal artery hardening or palpable pulse. Neck: Supple, no carotid bruits  Cardiac: Regular rate rhythm, no murmur  Musculoskeletal: No deformity   Neurological examination  Mentation: Alert oriented to time, place, history taking. Follows all commands speech and language fluent Cranial nerve: Normal r taste and smell, Pupils were equal round reactive to light, the  extraocular movements were full, visual field were full on confrontational test.  Facial sensation and strength were normal. hearing was intact to finger rubbing bilaterally. Uvula tongue midline. head turning  and shoulder shrug and were normal and symmetric.Tongue protrusion into cheek strength was normal. Motor: The motor testing reveals 5 / 5 strength of all 4 extremities. Normal  motor tone is noted throughout.  I noted a thenar eminence atrophy of the left thumb, with finger flexion the index finger and middle finger of the left hand are not completely able to flex - slightly stiffer. Positive Tinel sign. The patient will be worked up for carpal tunnel.   Sensory: Sensory testing is intact to soft touch on all 4 extremities. No evidence of extinction is noted.  Coordination: Cerebellar testing reveals good finger-nose-finger and heel-to-shin bilaterally.  Gait and station: Gait is normal. Tandem gait is normal. Romberg is negative.  Reflexes: Deep tendon reflexes are symmetric and normal bilaterally.    ASSESSMENT: 51 y.o. year old female  has  DVT (deep vein thrombosis); Fracture; Allergic rhinitis; and   1) Carpal tunnel, left dominant .    2) Decreased severity of headaches here with facial pain.  Having both, TMJ and a  Migraine  component.  She has improved since last visit with massage and [facility].  3) She has rosacea , worsening over the last 3 years.    PLAN: Continue Imitrex tablets as needed for acute Migraine ( This is marked by photohobia ) .  Continue massage and [facility] for TMJ related  headache prevention.    Shameika Speelman, MD  09/01/2016, 8:55 AM [facility] [address] [place], [place] [address] [phone]

## 2016-09-03 ENCOUNTER — Other Ambulatory Visit: Payer: Self-pay | Admitting: Internal Medicine

## 2016-09-03 ENCOUNTER — Other Ambulatory Visit: Payer: Self-pay | Admitting: Neurology

## 2016-09-03 DIAGNOSIS — R51 Headache: Secondary | ICD-10-CM

## 2016-09-03 DIAGNOSIS — M26609 Unspecified temporomandibular joint disorder, unspecified side: Secondary | ICD-10-CM

## 2016-09-03 DIAGNOSIS — R519 Headache, unspecified: Secondary | ICD-10-CM

## 2016-09-03 NOTE — Telephone Encounter (Signed)
Ok x 1 for each

## 2016-09-04 ENCOUNTER — Other Ambulatory Visit: Payer: Self-pay | Admitting: Emergency Medicine

## 2016-09-04 MED ORDER — ALPRAZOLAM 0.25 MG PO TABS: 0.2500 mg | ORAL_TABLET | Freq: Every evening | ORAL | 1 refills | Status: AC | PRN

## 2017-01-19 ENCOUNTER — Other Ambulatory Visit: Payer: Self-pay | Admitting: Internal Medicine

## 2017-01-20 NOTE — Telephone Encounter (Signed)
Ok to refill x 2  I sent in

## 2017-04-30 ENCOUNTER — Encounter: Payer: Self-pay | Admitting: Internal Medicine

## 2017-09-01 ENCOUNTER — Ambulatory Visit: Payer: BLUE CROSS/BLUE SHIELD | Admitting: Adult Health

## 2017-10-11 DIAGNOSIS — L02211 Cutaneous abscess of abdominal wall: Secondary | ICD-10-CM | POA: Diagnosis not present

## 2017-10-11 DIAGNOSIS — L720 Epidermal cyst: Secondary | ICD-10-CM | POA: Diagnosis not present

## 2017-10-22 ENCOUNTER — Other Ambulatory Visit: Payer: Self-pay | Admitting: Internal Medicine

## 2017-10-27 NOTE — Telephone Encounter (Signed)
Pt has a CPE appointment on 11/22/2017 at 8.30am. Refill for motrin was provided enough to last until the appointment day

## 2017-11-01 DIAGNOSIS — D485 Neoplasm of uncertain behavior of skin: Secondary | ICD-10-CM | POA: Diagnosis not present

## 2017-11-01 DIAGNOSIS — D2272 Melanocytic nevi of left lower limb, including hip: Secondary | ICD-10-CM | POA: Diagnosis not present

## 2017-11-01 DIAGNOSIS — L821 Other seborrheic keratosis: Secondary | ICD-10-CM | POA: Diagnosis not present

## 2017-11-01 DIAGNOSIS — Z23 Encounter for immunization: Secondary | ICD-10-CM | POA: Diagnosis not present

## 2017-11-09 NOTE — Progress Notes (Deleted)
No chief complaint on file.   HPI: Patient  Kendra Fox  52 y.o. comes in today for Preventive Health Care visit   Health Maintenance  Topic Date Due  . HIV Screening  06/30/1983  . TETANUS/TDAP  08/10/2014  . PAP SMEAR  11/09/2015  . INFLUENZA VACCINE  03/10/2018   Health Maintenance Review LIFESTYLE:  Exercise:   Tobacco/ETS: Alcohol:  Sugar beverages: Sleep: Drug use: no HH of  Work:    ROS:  GEN/ HEENT: No fever, significant weight changes sweats headaches vision problems hearing changes, CV/ PULM; No chest pain shortness of breath cough, syncope,edema  change in exercise tolerance. GI /GU: No adominal pain, vomiting, change in bowel habits. No blood in the stool. No significant GU symptoms. SKIN/HEME: ,no acute skin rashes suspicious lesions or bleeding. No lymphadenopathy, nodules, masses.  NEURO/ PSYCH:  No neurologic signs such as weakness numbness. No depression anxiety. IMM/ Allergy: No unusual infections.  Allergy .   REST of 12 system review negative except as per HPI   Past Medical History:  Diagnosis Date  . Allergic rhinitis   . Bilateral carpal tunnel syndrome 02/26/2016  . Environmental and seasonal allergies    Ragweed, grasses, dust mite, mold  . Fracture    left ankle  . History of DVT (deep vein thrombosis)    2006   sp ankle fracture  immobilization   . Increased severity of headaches     Past Surgical History:  Procedure Laterality Date  . L ankle frac Left 2015  . TOOTH EXTRACTION    . WRIST FRACTURE SURGERY Right 2015   Dr. Amedeo Plenty    Family History  Problem Relation Age of Onset  . Migraines Mother   . COPD Father        smoker  . Parkinsonism Father     Social History   Socioeconomic History  . Marital status: Married    Spouse name: [name]  . Number of children: 0  . Years of education: College  . Highest education level: Not on file  Occupational History    Employer: [employer]  Social Needs  .  Financial resource strain: Not on file  . Food insecurity:    Worry: Not on file    Inability: Not on file  . Transportation needs:    Medical: Not on file    Non-medical: Not on file  Tobacco Use  . Smoking status: Former Research scientist (life sciences)  . Smokeless tobacco: Never Used  . Tobacco comment: Quit in 2004  Substance and Sexual Activity  . Alcohol use: Yes    Alcohol/week: 3.6 oz    Types: 6 Glasses of wine per week    Comment: social  . Drug use: No  . Sexual activity: Not on file  Lifestyle  . Physical activity:    Days per week: Not on file    Minutes per session: Not on file  . Stress: Not on file  Relationships  . Social connections:    Talks on phone: Not on file    Gets together: Not on file    Attends religious service: Not on file    Active member of club or organization: Not on file    Attends meetings of clubs or organizations: Not on file    Relationship status: Not on file  Other Topics Concern  . Not on file  Social History Narrative   Patient is married [name])   Patient works at [employer].   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   Married  HHof 2       Regular exercise-no   Vegan soy milk     neg tobacco 1-2 tea    8 hours sleep    Patient is right-handed.   Patient drinks 2-3 cups of coffee daily.   Patient has a college education.    Outpatient Medications Prior to Visit  Medication Sig Dispense Refill  . ALPRAZolam (XANAX) 0.25 MG tablet Take 1 tablet (0.25 mg total) by mouth at bedtime as needed. 30 tablet 1  . ampicillin (PRINCIPEN) 500 MG capsule 500 mg. Reported on 02/27/2016  0  . b complex vitamins tablet Take 1 tablet by mouth daily.    . cholecalciferol (VITAMIN D) 1000 units tablet Take 1,000 Units by mouth daily.    . fexofenadine (ALLEGRA) 30 MG tablet Take 30 mg by mouth 2 (two) times daily.    . fluticasone (FLONASE) 50 MCG/ACT nasal spray Place into both nostrils daily.      Marland Kitchen ibuprofen (ADVIL,MOTRIN) 800 MG tablet TAKE 1 TABLET (800 MG TOTAL) BY MOUTH EVERY 8 (EIGHT) HOURS AS NEEDED. 30 tablet 0  . MULTIPLE VITAMIN PO Take by mouth daily.     . Probiotic Product (PROBIOTIC PO) Take 1 tablet by mouth daily.    . SUMAtriptan (IMITREX) 25 MG tablet Take 1 tablet (25 mg total) by mouth once. As needed for Migraine, may repeat in 2 hours if headache persists or recurs. 10 tablet 0   No facility-administered medications prior to visit.      EXAM:  There were no vitals taken for this visit.  There is no height or weight on file to calculate BMI. Wt Readings from Last 3 Encounters:  09/01/16 167 lb (75.8 kg)  04/29/16 164 lb 4.8 oz (74.5 kg)  02/27/16 166 lb (75.3 kg)    Physical Exam: Vital signs reviewed [id number] is a well-developed well-nourished alert cooperative    who appearsr stated age in no acute distress.  HEENT: normocephalic atraumatic , Eyes: PERRL EOM's full, conjunctiva clear, Nares: paten,t no deformity discharge or tenderness., Ears: no deformity EAC's clear TMs with normal landmarks. Mouth: clear OP, no lesions, edema.  Moist mucous membranes. Dentition in adequate repair. NECK: supple without masses, thyromegaly or bruits. CHEST/PULM:  Clear to auscultation and percussion breath sounds equal no wheeze , rales or rhonchi. No chest wall deformities or tenderness. Breast: normal by inspection . No dimpling, discharge, masses, tenderness or discharge . CV: PMI is nondisplaced, S1 S2 no gallops, murmurs, rubs. Peripheral pulses are full without delay.No JVD .  ABDOMEN: Bowel sounds normal nontender  No guard or rebound, no hepato splenomegal no CVA tenderness.  No hernia. Extremtities:  No clubbing cyanosis or edema, no acute joint swelling or redness no focal atrophy NEURO:  Oriented x3, cranial nerves 3-12 appear to be intact, no obvious focal weakness,gait within normal limits no abnormal reflexes or asymmetrical SKIN: No acute rashes normal  turgor, color, no bruising or petechiae. PSYCH: Oriented, good eye contact, no obvious depression anxiety, cognition and judgment appear normal. LN: no cervical axillary inguinal adenopathy  Lab Results  Component Value Date   WBC 6.6 04/17/2016   HGB 14.9 04/17/2016   HCT 43.1 04/17/2016   PLT 413.0 (H) 04/17/2016   GLUCOSE 100 (H) 04/17/2016   CHOL 200 04/17/2016   TRIG 90.0 04/17/2016   HDL 51.50 04/17/2016   LDLDIRECT 121.0  04/25/2013   LDLCALC 130 (H) 04/17/2016   ALT 17 04/17/2016   AST 18 04/17/2016   NA 138 04/17/2016   K 4.2 04/17/2016   CL 104 04/17/2016   CREATININE 0.64 04/17/2016   BUN 14 04/17/2016   CO2 27 04/17/2016   TSH 1.96 04/17/2016    BP Readings from Last 3 Encounters:  09/01/16 100/70  04/29/16 120/80  02/27/16 118/70    Lab results reviewed with patient   ASSESSMENT AND PLAN:  Discussed the following assessment and plan:  Visit for preventive health examination  Medication management  Vitamin D deficiency  Patient Care Team: Panosh, Standley Brooking, MD as PCP - General Marylynn Pearson, MD as Consulting Physician (Obstetrics and Gynecology) Danella Sensing, MD as Consulting Physician (Dermatology) Roseanne Kaufman, MD as Consulting Physician (Orthopedic Surgery) Dohmeier, Asencion Partridge, MD as Consulting Physician (Neurology) There are no Patient Instructions on file for this visit.  Standley Brooking. Panosh M.D.

## 2017-11-12 ENCOUNTER — Encounter: Payer: BLUE CROSS/BLUE SHIELD | Admitting: Internal Medicine

## 2017-11-29 ENCOUNTER — Ambulatory Visit: Payer: BLUE CROSS/BLUE SHIELD | Admitting: Adult Health

## 2017-11-30 NOTE — Progress Notes (Deleted)
No chief complaint on file.   HPI: Patient  Kendra Fox  52 y.o. comes in today for Preventive Health Care visit   Health Maintenance  Topic Date Due  . HIV Screening  06/30/1983  . TETANUS/TDAP  08/10/2014  . PAP SMEAR  11/09/2015  . INFLUENZA VACCINE  03/10/2018   Health Maintenance Review LIFESTYLE:  Exercise:   Tobacco/ETS: Alcohol:  Sugar beverages: Sleep: Drug use: no HH of  Work:    ROS:  GEN/ HEENT: No fever, significant weight changes sweats headaches vision problems hearing changes, CV/ PULM; No chest pain shortness of breath cough, syncope,edema  change in exercise tolerance. GI /GU: No adominal pain, vomiting, change in bowel habits. No blood in the stool. No significant GU symptoms. SKIN/HEME: ,no acute skin rashes suspicious lesions or bleeding. No lymphadenopathy, nodules, masses.  NEURO/ PSYCH:  No neurologic signs such as weakness numbness. No depression anxiety. IMM/ Allergy: No unusual infections.  Allergy .   REST of 12 system review negative except as per HPI   Past Medical History:  Diagnosis Date  . Allergic rhinitis   . Bilateral carpal tunnel syndrome 02/26/2016  . Environmental and seasonal allergies    Ragweed, grasses, dust mite, mold  . Fracture    left ankle  . History of DVT (deep vein thrombosis)    2006   sp ankle fracture  immobilization   . Increased severity of headaches     Past Surgical History:  Procedure Laterality Date  . L ankle frac Left 2015  . TOOTH EXTRACTION    . WRIST FRACTURE SURGERY Right 2015   Dr. Amedeo Plenty    Family History  Problem Relation Age of Onset  . Migraines Mother   . COPD Father        smoker  . Parkinsonism Father     Social History   Socioeconomic History  . Marital status: Married    Spouse name: Kendra Fox  . Number of children: 0  . Years of education: College  . Highest education level: Not on file  Occupational History    Employer: [employer]  Social Needs  .  Financial resource strain: Not on file  . Food insecurity:    Worry: Not on file    Inability: Not on file  . Transportation needs:    Medical: Not on file    Non-medical: Not on file  Tobacco Use  . Smoking status: Former Research scientist (life sciences)  . Smokeless tobacco: Never Used  . Tobacco comment: Quit in 2004  Substance and Sexual Activity  . Alcohol use: Yes    Alcohol/week: 3.6 oz    Types: 6 Glasses of wine per week    Comment: social  . Drug use: No  . Sexual activity: Not on file  Lifestyle  . Physical activity:    Days per week: Not on file    Minutes per session: Not on file  . Stress: Not on file  Relationships  . Social connections:    Talks on phone: Not on file    Gets together: Not on file    Attends religious service: Not on file    Active member of club or organization: Not on file    Attends meetings of clubs or organizations: Not on file    Relationship status: Not on file  Other Topics Concern  . Not on file  Social History Narrative   Patient is married Kendra Fox)   Patient works at [employer].   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   Married  HHof 2       Regular exercise-no   Vegan soy milk     neg tobacco 1-2 tea    8 hours sleep    Patient is right-handed.   Patient drinks 2-3 cups of coffee daily.   Patient has a college education.    Outpatient Medications Prior to Visit  Medication Sig Dispense Refill  . ALPRAZolam (XANAX) 0.25 MG tablet Take 1 tablet (0.25 mg total) by mouth at bedtime as needed. 30 tablet 1  . ampicillin (PRINCIPEN) 500 MG capsule 500 mg. Reported on 02/27/2016  0  . b complex vitamins tablet Take 1 tablet by mouth daily.    . cholecalciferol (VITAMIN D) 1000 units tablet Take 1,000 Units by mouth daily.    . fexofenadine (ALLEGRA) 30 MG tablet Take 30 mg by mouth 2 (two) times daily.    . fluticasone (FLONASE) 50 MCG/ACT nasal spray Place into both nostrils daily.      Marland Kitchen ibuprofen (ADVIL,MOTRIN) 800 MG tablet TAKE 1 TABLET (800 MG TOTAL) BY MOUTH EVERY 8 (EIGHT) HOURS AS NEEDED. 30 tablet 0  . MULTIPLE VITAMIN PO Take by mouth daily.     . Probiotic Product (PROBIOTIC PO) Take 1 tablet by mouth daily.    . SUMAtriptan (IMITREX) 25 MG tablet Take 1 tablet (25 mg total) by mouth once. As needed for Migraine, may repeat in 2 hours if headache persists or recurs. 10 tablet 0   No facility-administered medications prior to visit.      EXAM:  There were no vitals taken for this visit.  There is no height or weight on file to calculate BMI. Wt Readings from Last 3 Encounters:  09/01/16 167 lb (75.8 kg)  04/29/16 164 lb 4.8 oz (74.5 kg)  02/27/16 166 lb (75.3 kg)    Physical Exam: Vital signs reviewed [id number] is a well-developed well-nourished alert cooperative    who appearsr stated age in no acute distress.  HEENT: normocephalic atraumatic , Eyes: PERRL EOM's full, conjunctiva clear, Nares: paten,t no deformity discharge or tenderness., Ears: no deformity EAC's clear TMs with normal landmarks. Mouth: clear OP, no lesions, edema.  Moist mucous membranes. Dentition in adequate repair. NECK: supple without masses, thyromegaly or bruits. CHEST/PULM:  Clear to auscultation and percussion breath sounds equal no wheeze , rales or rhonchi. No chest wall deformities or tenderness. Breast: normal by inspection . No dimpling, discharge, masses, tenderness or discharge . CV: PMI is nondisplaced, S1 S2 no gallops, murmurs, rubs. Peripheral pulses are full without delay.No JVD .  ABDOMEN: Bowel sounds normal nontender  No guard or rebound, no hepato splenomegal no CVA tenderness.  No hernia. Extremtities:  No clubbing cyanosis or edema, no acute joint swelling or redness no focal atrophy NEURO:  Oriented x3, cranial nerves 3-12 appear to be intact, no obvious focal weakness,gait within normal limits no abnormal reflexes or asymmetrical SKIN: No acute rashes normal  turgor, color, no bruising or petechiae. PSYCH: Oriented, good eye contact, no obvious depression anxiety, cognition and judgment appear normal. LN: no cervical axillary inguinal adenopathy  Lab Results  Component Value Date   WBC 6.6 04/17/2016   HGB 14.9 04/17/2016   HCT 43.1 04/17/2016   PLT 413.0 (H) 04/17/2016   GLUCOSE 100 (H) 04/17/2016   CHOL 200 04/17/2016   TRIG 90.0 04/17/2016   HDL 51.50 04/17/2016   LDLDIRECT 121.0  04/25/2013   LDLCALC 130 (H) 04/17/2016   ALT 17 04/17/2016   AST 18 04/17/2016   NA 138 04/17/2016   K 4.2 04/17/2016   CL 104 04/17/2016   CREATININE 0.64 04/17/2016   BUN 14 04/17/2016   CO2 27 04/17/2016   TSH 1.96 04/17/2016    BP Readings from Last 3 Encounters:  09/01/16 100/70  04/29/16 120/80  02/27/16 118/70    Lab results reviewed with patient   ASSESSMENT AND PLAN:  Discussed the following assessment and plan:  Visit for preventive health examination plt gb ldl Patient Care Team: Burnis Medin, MD as PCP - General Marylynn Pearson, MD as Consulting Physician (Obstetrics and Gynecology) Danella Sensing, MD as Consulting Physician (Dermatology) Roseanne Kaufman, MD as Consulting Physician (Orthopedic Surgery) Dohmeier, Asencion Partridge, MD as Consulting Physician (Neurology) There are no Patient Instructions on file for this visit.  Standley Brooking. Panosh M.D.

## 2017-12-01 ENCOUNTER — Encounter: Payer: BLUE CROSS/BLUE SHIELD | Admitting: Internal Medicine

## 2017-12-02 ENCOUNTER — Encounter: Payer: Self-pay | Admitting: Adult Health

## 2017-12-02 ENCOUNTER — Ambulatory Visit: Payer: BLUE CROSS/BLUE SHIELD | Admitting: Adult Health

## 2017-12-02 VITALS — BP 109/74 | HR 76 | Ht 65.5 in | Wt 168.2 lb

## 2017-12-02 DIAGNOSIS — G43009 Migraine without aura, not intractable, without status migrainosus: Secondary | ICD-10-CM | POA: Diagnosis not present

## 2017-12-02 DIAGNOSIS — G5603 Carpal tunnel syndrome, bilateral upper limbs: Secondary | ICD-10-CM | POA: Diagnosis not present

## 2017-12-02 MED ORDER — SUMATRIPTAN SUCCINATE 25 MG PO TABS: ORAL_TABLET | ORAL | 11 refills | Status: DC

## 2017-12-02 NOTE — Patient Instructions (Signed)
Your Plan:  Continue Imitrex If your symptoms worsen or you develop new symptoms please let us know.   Thank you for coming to see [place] at [facility]. I hope we have been able to provide you high quality care today.  You may receive a patient satisfaction survey over the next few weeks. We would appreciate your feedback and comments so that we may continue to improve ourselves and the health of our patients.

## 2017-12-02 NOTE — Progress Notes (Signed)
PATIENT: Kendra Fox DOB: January 16, 1966  REASON FOR VISIT: follow up HISTORY FROM: patient  HISTORY OF PRESENT ILLNESS: Today 12/02/17 Kendra Fox is a 52 year old female with a history of bilateral carpal tunnel syndrome and headaches.  She returns today for follow-up.  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  She states that since then she has had no issues with carpal tunnel.  She denies any numbness or pain in the hand.  She no longer has to wear the wrist splints.  She states her headaches are under relatively good control.  She only has 4-6 headaches a year.  She uses Imitrex with good benefit.  She returns today for evaluation.  HISTORY 09-01-2016, Kendra Fox is a 52 y.o. female Is seen here as a referral from Dr. Regis Bill , the patient follows up on a diagnosis of bilateral carpal tunnel syndrome established and an EMG and nerve conduction study on 02/26/2016 performed by Dr. Lenor Coffin. She feels that her wrist and hands have been less painful she does have less of the numbness unless she has intricate movements with both hands. For while she used braces to relieve pressure on the carpal tunnel and this helped especially while driving. Occasionally, she will still wake up with a numbness radiating from the shoulder to what the hands. She does not report clumsiness or dropping objects with both hands for either hand. She has also reported that her migraines are very well controlled, needs refills.    REVIEW OF SYSTEMS: Out of a complete 14 system review of symptoms, the patient complains only of the following symptoms, and all other reviewed systems are negative.  Environmental allergies  ALLERGIES: Allergies  Allergen Reactions  . Minocycline Itching    hives    HOME MEDICATIONS: Outpatient Medications Prior to Visit  Medication Sig Dispense Refill  . ALPRAZolam (XANAX) 0.25 MG tablet Take 1 tablet (0.25 mg  total) by mouth at bedtime as needed. 30 tablet 1  . ampicillin (PRINCIPEN) 500 MG capsule 500 mg. Reported on 02/27/2016  0  . b complex vitamins tablet Take 1 tablet by mouth daily.    . fexofenadine (ALLEGRA) 30 MG tablet Take 30 mg by mouth 2 (two) times daily.    . fluticasone (FLONASE) 50 MCG/ACT nasal spray Place into both nostrils daily.    Marland Kitchen ibuprofen (ADVIL,MOTRIN) 800 MG tablet TAKE 1 TABLET (800 MG TOTAL) BY MOUTH EVERY 8 (EIGHT) HOURS AS NEEDED. 30 tablet 0  . MULTIPLE VITAMIN PO Take by mouth daily.     . Probiotic Product (PROBIOTIC PO) Take 1 tablet by mouth daily.    . cholecalciferol (VITAMIN D) 1000 units tablet Take 1,000 Units by mouth daily.    . SUMAtriptan (IMITREX) 25 MG tablet Take 1 tablet (25 mg total) by mouth once. As needed for Migraine, may repeat in 2 hours if headache persists or recurs. 10 tablet 0   No facility-administered medications prior to visit.     PAST MEDICAL HISTORY: Past Medical History:  Diagnosis Date  . Allergic rhinitis   . Bilateral carpal tunnel syndrome 02/26/2016  . Environmental and seasonal allergies    Ragweed, grasses, dust mite, mold  . Fracture    left ankle  . History of DVT (deep vein thrombosis)    2006   sp ankle fracture  immobilization   . Increased severity of headaches     PAST SURGICAL HISTORY: Past Surgical  History:  Procedure Laterality Date  . L ankle frac Left 2015  . TOOTH EXTRACTION    . WRIST FRACTURE SURGERY Right 2015   Dr. Amedeo Plenty    FAMILY HISTORY: Family History  Problem Relation Age of Onset  . Migraines Mother   . COPD Father        smoker  . Parkinsonism Father     SOCIAL HISTORY: Social History   Socioeconomic History  . Marital status: Married    Spouse name: [name]  . Number of children: 0  . Years of education: College  . Highest education level: Not on file  Occupational History    Employer: [employer]  Social Needs  . Financial resource strain: Not on file  . Food  insecurity:    Worry: Not on file    Inability: Not on file  . Transportation needs:    Medical: Not on file    Non-medical: Not on file  Tobacco Use  . Smoking status: Former Research scientist (life sciences)  . Smokeless tobacco: Never Used  . Tobacco comment: Quit in 2004  Substance and Sexual Activity  . Alcohol use: Yes    Alcohol/week: 3.6 oz    Types: 6 Glasses of wine per week    Comment: social  . Drug use: No  . Sexual activity: Not on file  Lifestyle  . Physical activity:    Days per week: Not on file    Minutes per session: Not on file  . Stress: Not on file  Relationships  . Social connections:    Talks on phone: Not on file    Gets together: Not on file    Attends religious service: Not on file    Active member of club or organization: Not on file    Attends meetings of clubs or organizations: Not on file    Relationship status: Not on file  . Intimate partner violence:    Fear of current or ex partner: Not on file    Emotionally abused: Not on file    Physically abused: Not on file    Forced sexual activity: Not on file  Other Topics Concern  . Not on file  Social History Narrative   Patient is married [name])   Patient works at [employer].   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   Married  HHof 2       Regular exercise-no   Vegan soy milk     neg tobacco 1-2 tea    8 hours sleep    Patient is right-handed.   Patient drinks 2-3 cups of coffee daily.   Patient has a college education.      PHYSICAL EXAM  Vitals:   12/02/17 0752  BP: 109/74  Pulse: 76  Weight: 168 lb 3.2 oz (76.3 kg)  Height: 5' 5.5" (1.664 m)   Body mass index is 27.56 kg/m.  Generalized: Well developed, in no acute distress   Neurological examination  Mentation: Alert oriented to time, place, history taking. Follows all commands speech and language fluent Cranial nerve II-XII: Pupils were equal round reactive to light.  Extraocular movements were full, visual field were full on confrontational test. Facial sensation and strength were normal. Uvula tongue midline. Head turning and shoulder shrug  were normal and symmetric. Motor: The motor testing reveals 5 over 5 strength of all 4 extremities. Good symmetric motor tone is noted throughout.  Sensory: Sensory testing  is intact to soft touch on all 4 extremities. No evidence of extinction is noted.  Coordination: Cerebellar testing reveals good finger-nose-finger and heel-to-shin bilaterally.  Gait and station: Gait is normal. Tandem gait is normal. Romberg is negative. No drift is seen.  Reflexes: Deep tendon reflexes are symmetric and normal bilaterally.   DIAGNOSTIC DATA (LABS, IMAGING, TESTING) - I reviewed patient records, labs, notes, testing and imaging myself where available.  Lab Results  Component Value Date   WBC 6.6 04/17/2016   HGB 14.9 04/17/2016   HCT 43.1 04/17/2016   MCV 89.7 04/17/2016   PLT 413.0 (H) 04/17/2016      Component Value Date/Time   NA 138 04/17/2016 0817   NA 138 11/15/2013 0959   K 4.2 04/17/2016 0817   CL 104 04/17/2016 0817   CO2 27 04/17/2016 0817   GLUCOSE 100 (H) 04/17/2016 0817   BUN 14 04/17/2016 0817   BUN 13 11/15/2013 0959   CREATININE 0.64 04/17/2016 0817   CALCIUM 9.0 04/17/2016 0817   CALCIUM 9.4 07/14/2007 0123   PROT 7.2 04/17/2016 0817   PROT 7.0 11/15/2013 0959   ALBUMIN 4.3 04/17/2016 0817   ALBUMIN 4.5 11/15/2013 0959   AST 18 04/17/2016 0817   ALT 17 04/17/2016 0817   ALKPHOS 31 (L) 04/17/2016 0817   BILITOT 0.5 04/17/2016 0817   GFRNONAA 108 11/15/2013 0959   GFRAA 124 11/15/2013 0959   Lab Results  Component Value Date   CHOL 200 04/17/2016   HDL 51.50 04/17/2016   LDLCALC 130 (H) 04/17/2016   LDLDIRECT 121.0 04/25/2013   TRIG 90.0 04/17/2016   CHOLHDL 4 04/17/2016       ASSESSMENT AND PLAN 52 y.o. year old female  has a past medical history of Allergic rhinitis, Bilateral  carpal tunnel syndrome (02/26/2016), Environmental and seasonal allergies, Fracture, History of DVT (deep vein thrombosis), and Increased severity of headaches. here with:  1.  Bilateral carpal node syndrome 2.  Migraine headache  Overall the patient is doing well.  She is no longer having issues with carpal tunnel syndrome.  Her headaches are under good control.  She continues to use Imitrex.  This will be refilled today.  She can follow-up with our office on an as-needed basis.   Ward Givens, MSN, NP-C 12/02/2017, 8:19 AM [facility] [address] [place], [place] [address] [phone]

## 2017-12-06 NOTE — Progress Notes (Signed)
I agree with the assessment and plan as directed by NP .The patient is known to me .   Boyd Buffalo, MD  

## 2017-12-13 DIAGNOSIS — Z01419 Encounter for gynecological examination (general) (routine) without abnormal findings: Secondary | ICD-10-CM | POA: Diagnosis not present

## 2017-12-13 DIAGNOSIS — Z6827 Body mass index (BMI) 27.0-27.9, adult: Secondary | ICD-10-CM | POA: Diagnosis not present

## 2017-12-13 DIAGNOSIS — Z1231 Encounter for screening mammogram for malignant neoplasm of breast: Secondary | ICD-10-CM | POA: Diagnosis not present

## 2018-01-20 ENCOUNTER — Encounter: Payer: BLUE CROSS/BLUE SHIELD | Admitting: Internal Medicine

## 2018-03-03 NOTE — Progress Notes (Signed)
Chief Complaint  Patient presents with  . Annual Exam    Discuss Imitrex management. / Tick bite x 6 weeks ago, black spot and reddness -- pt does not think that she got all of the tick out when she tried to remove it. Denies itching, tenderness or pain    HPI: Patient  Kendra Fox  52 y.o. comes in today for Preventive Health Care visit  And med management     [facility]  Seen  Dr Beacher May   Prn can we rx th emed at this time ? Since doing well   Gets  migraine with aura.   And   Takes imitrex and  Lays down .   frequency : 2- 5 per year.    Needs refill ibuprofen    alpraz ocass  As needed.   Claustrophobic  Situations    When travel  In airport.  jusband from [place].  No need for now.    Vit d  Asks   Recheck hx of same low and injuries   multivit .   Tick right trunk not sure all removed  Slight itch  No pain please check  Health Maintenance  Topic Date Due  . HIV Screening  06/30/1983  . TETANUS/TDAP  08/10/2014  . INFLUENZA VACCINE  03/10/2018  . PAP SMEAR  10/08/2020   Health Maintenance Review LIFESTYLE:  Exercise:   Deliver  Power WC active could be better .  Tobacco/ETS: no Alcohol:  social Sugar beverages:  ocass juice  Sleep: 8  Drug use: no HH of  2 poet cats and 2   Work  40 - 89     Gyne   Periods getting sporadic.     ROS:  GEN/ HEENT: No fever, significant weight changes sweats headaches vision problems hearing changes, CV/ PULM; No chest pain shortness of breath cough, syncope,edema  change in exercise tolerance. GI /GU: No adominal pain, vomiting, change in bowel habits. No blood in the stool. No significant GU symptoms. SKIN/HEME: ,no acute skin rashes suspicious lesions or bleeding. No lymphadenopathy, nodules, masses.  NEURO/ PSYCH:  No neurologic signs such as weakness numbness. No depression anxiety. IMM/ Allergy: No unusual infections.  Allergy .   REST of 12 system review negative except as per HPI   Past Medical History:  Diagnosis  Date  . Allergic rhinitis   . Bilateral carpal tunnel syndrome 02/26/2016  . Environmental and seasonal allergies    Ragweed, grasses, dust mite, mold  . Fracture    left ankle  . History of DVT (deep vein thrombosis)    2006   sp ankle fracture  immobilization   . Increased severity of headaches     Past Surgical History:  Procedure Laterality Date  . L ankle frac Left 2015  . TOOTH EXTRACTION    . WRIST FRACTURE SURGERY Right 2015   Dr. Amedeo Plenty    Family History  Problem Relation Age of Onset  . Migraines Mother   . COPD Father        smoker  . Parkinsonism Father   fam hx  gps  Long lived  Heart failure and cv ? No premature Cv disease   Social History   Socioeconomic History  . Marital status: Married    Spouse name: [name]  . Number of children: 0  . Years of education: College  . Highest education level: Not on file  Occupational History    Employer: [employer]  Social Needs  .  Financial resource strain: Not on file  . Food insecurity:    Worry: Not on file    Inability: Not on file  . Transportation needs:    Medical: Not on file    Non-medical: Not on file  Tobacco Use  . Smoking status: Former Research scientist (life sciences)  . Smokeless tobacco: Never Used  . Tobacco comment: Quit in 2004  Substance and Sexual Activity  . Alcohol use: Yes    Alcohol/week: 3.6 oz    Types: 6 Glasses of wine per week    Comment: social  . Drug use: No  . Sexual activity: Not on file  Lifestyle  . Physical activity:    Days per week: Not on file    Minutes per session: Not on file  . Stress: Not on file  Relationships  . Social connections:    Talks on phone: Not on file    Gets together: Not on file    Attends religious service: Not on file    Active member of club or organization: Not on file    Attends meetings of clubs or organizations: Not on file    Relationship status: Not on file  Other Topics Concern  . Not on file  Social History Narrative   Patient is married  [name])   Patient works at [employer].   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   Married  HHof 2       Regular exercise-no   Vegan soy milk     neg tobacco 1-2 tea    8 hours sleep    Patient is right-handed.   Patient drinks 2-3 cups of coffee daily.   Patient has a college education.    Outpatient Medications Prior to Visit  Medication Sig Dispense Refill  . ALPRAZolam (XANAX) 0.25 MG tablet Take 1 tablet (0.25 mg total) by mouth at bedtime as needed. 30 tablet 1  . ampicillin (PRINCIPEN) 500 MG capsule 500 mg. Reported on 02/27/2016  0  . b complex vitamins tablet Take 1 tablet by mouth daily.    . fexofenadine (ALLEGRA) 30 MG tablet Take 30 mg by mouth 2 (two) times daily.    . fluticasone (FLONASE) 50 MCG/ACT nasal spray Place into both nostrils daily.    . MULTIPLE VITAMIN PO Take by mouth daily.     . SUMAtriptan (IMITREX) 25 MG tablet Take 1 tablet at the onset of migraine. If headache persist can take an additional tablet. Not to exceed 2 tablets in 24 hours. 10 tablet 11  . ibuprofen (ADVIL,MOTRIN) 800 MG tablet TAKE 1 TABLET (800 MG TOTAL) BY MOUTH EVERY 8 (EIGHT) HOURS AS NEEDED. 30 tablet 0   No facility-administered medications prior to visit.      EXAM:  BP 108/68 (BP Location: Right Arm, Patient Position: Sitting, Cuff Size: Normal)   Pulse 70   Temp 98.3 F (36.8 C) (Oral)   Ht 5' 4.25" (1.632 m)   Wt 167 lb 4.8 oz (75.9 kg)   BMI 28.49 kg/m   Body mass index is 28.49 kg/m. Wt Readings from Last 3 Encounters:  03/04/18 167 lb 4.8 oz (75.9 kg)  12/02/17 168 lb 3.2 oz (76.3 kg)  09/01/16 167 lb (75.8 kg)    Physical Exam: Vital signs reviewed MBE:MLJQ is a well-developed well-nourished alert cooperative    who appearsr stated age in no acute distress.  HEENT: normocephalic atraumatic , Eyes: PERRL EOM's  full, conjunctiva clear, Nares: paten,t no deformity discharge or tenderness.,  Ears: no deformity EAC's clear TMs with normal landmarks. Mouth: clear OP, no lesions, edema.  Moist mucous membranes. Dentition in adequate repair. NECK: supple without masses, thyromegaly or bruits. CHEST/PULM:  Clear to auscultation and percussion breath sounds equal no wheeze , rales or rhonchi. No chest wall deformities or tenderness. Breast: defered  Per gyne  CV: PMI is nondisplaced, S1 S2 no gallops, murmurs, rubs. Peripheral pulses are full without delay.No JVD .  ABDOMEN: Bowel sounds normal nontender  No guard or rebound, no hepato splenomegal no CVA tenderness.  No hernia. Extremtities:  No clubbing cyanosis or edema, no acute joint swelling or redness no focal atrophy NEURO:  Oriented x3, cranial nerves 3-12 appear to be intact, no obvious focal weakness,gait within normal limits no abnormal reflexes or asymmetrical SKIN: No acute rashes normal turgor, color, no bruising or petechiae. Right trunk small scab tick piece noted   Removed with  Forceps    Slight weeping of bite area   Covered with bandaid  PSYCH: Oriented, good eye contact, no obvious depression anxiety, cognition and judgment appear normal. LN: no cervical axillary inguinal adenopathy  Lab Results  Component Value Date   WBC 6.6 04/17/2016   HGB 14.9 04/17/2016   HCT 43.1 04/17/2016   PLT 413.0 (H) 04/17/2016   GLUCOSE 100 (H) 04/17/2016   CHOL 200 04/17/2016   TRIG 90.0 04/17/2016   HDL 51.50 04/17/2016   LDLDIRECT 121.0 04/25/2013   LDLCALC 130 (H) 04/17/2016   ALT 17 04/17/2016   AST 18 04/17/2016   NA 138 04/17/2016   K 4.2 04/17/2016   CL 104 04/17/2016   CREATININE 0.64 04/17/2016   BUN 14 04/17/2016   CO2 27 04/17/2016   TSH 1.96 04/17/2016    BP Readings from Last 3 Encounters:  03/04/18 108/68  12/02/17 109/74  09/01/16 100/70   fasting ASSESSMENT AND PLAN:  Discussed the following assessment and plan:  Visit for preventive health examination - Plan: Basic metabolic panel, CBC with  Differential/Platelet, Hepatic function panel, Lipid panel, TSH, VITAMIN D 25 Hydroxy (Vit-D Deficiency, Fractures)  Medication management - Plan: Basic metabolic panel, CBC with Differential/Platelet, Hepatic function panel, Lipid panel, TSH, VITAMIN D 25 Hydroxy (Vit-D Deficiency, Fractures)  Vitamin D deficiency - Plan: Basic metabolic panel, CBC with Differential/Platelet, Hepatic function panel, Lipid panel, TSH, VITAMIN D 25 Hydroxy (Vit-D Deficiency, Fractures)  Hyperlipidemia, unspecified hyperlipidemia type - Plan: Basic metabolic panel, CBC with Differential/Platelet, Hepatic function panel, Lipid panel, TSH, VITAMIN D 25 Hydroxy (Vit-D Deficiency, Fractures)  Migraine with aura and without status migrainosus, not intractable - Plan: Basic metabolic panel, CBC with Differential/Platelet, Hepatic function panel, Lipid panel, TSH, VITAMIN D 25 Hydroxy (Vit-D Deficiency, Fractures)  Tick bite with subsequent removal of tick  Patient Care Team: Panosh, Standley Brooking, MD as PCP - General Marylynn Pearson, MD as Consulting Physician (Obstetrics and Gynecology) Danella Sensing, MD as Consulting Physician (Dermatology) Roseanne Kaufman, MD as Consulting Physician (Orthopedic Surgery) Dohmeier, Asencion Partridge, MD as Consulting Physician (Neurology) Patient Instructions  Let us know when need refill  Migraine meds.   Will notify you  of labs when available.   Local care to tick bite   Continue lifestyle intervention healthy eating and exercise .   Health Maintenance, Female Adopting a healthy lifestyle and getting preventive care can go a long way to promote health and wellness. Talk with your health care provider about what schedule of regular examinations is  right for you. This is a good chance for you to check in with your provider about disease prevention and staying healthy. In between checkups, there are plenty of things you can do on your own. Experts have done a lot of research about which  lifestyle changes and preventive measures are most likely to keep you healthy. Ask your health care provider for more information. Weight and diet Eat a healthy diet  Be sure to include plenty of vegetables, fruits, low-fat dairy products, and lean protein.  Do not eat a lot of foods high in solid fats, added sugars, or salt.  Get regular exercise. This is one of the most important things you can do for your health. ? Most adults should exercise for at least 150 minutes each week. The exercise should increase your heart rate and make you sweat (moderate-intensity exercise). ? Most adults should also do strengthening exercises at least twice a week. This is in addition to the moderate-intensity exercise.  Maintain a healthy weight  Body mass index (BMI) is a measurement that can be used to identify possible weight problems. It estimates body fat based on height and weight. Your health care provider can help determine your BMI and help you achieve or maintain a healthy weight.  For females 96 years of age and older: ? A BMI below 18.5 is considered underweight. ? A BMI of 18.5 to 24.9 is normal. ? A BMI of 25 to 29.9 is considered overweight. ? A BMI of 30 and above is considered obese.  Watch levels of cholesterol and blood lipids  You should start having your blood tested for lipids and cholesterol at 50 years of age, then have this test every 5 years.  You may need to have your cholesterol levels checked more often if: ? Your lipid or cholesterol levels are high. ? You are older than 50 years of age. ? You are at high risk for heart disease.  Cancer screening Lung Cancer  Lung cancer screening is recommended for adults 79-9 years old who are at high risk for lung cancer because of a history of smoking.  A yearly low-dose CT scan of the lungs is recommended for people who: ? Currently smoke. ? Have quit within the past 15 years. ? Have at least a 30-pack-year history of  smoking. A pack year is smoking an average of one pack of cigarettes a day for 1 year.  Yearly screening should continue until it has been 15 years since you quit.  Yearly screening should stop if you develop a health problem that would prevent you from having lung cancer treatment.  Breast Cancer  Practice breast self-awareness. This means understanding how your breasts normally appear and feel.  It also means doing regular breast self-exams. Let your health care provider know about any changes, no matter how small.  If you are in your 20s or 30s, you should have a clinical breast exam (CBE) by a health care provider every 1-3 years as part of a regular health exam.  If you are 2 or older, have a CBE every year. Also consider having a breast X-ray (mammogram) every year.  If you have a family history of breast cancer, talk to your health care provider about genetic screening.  If you are at high risk for breast cancer, talk to your health care provider about having an MRI and a mammogram every year.  Breast cancer gene (BRCA) assessment is recommended for women who have family members with  BRCA-related cancers. BRCA-related cancers include: ? Breast. ? Ovarian. ? Tubal. ? Peritoneal cancers.  Results of the assessment will determine the need for genetic counseling and BRCA1 and BRCA2 testing.  Cervical Cancer Your health care provider may recommend that you be screened regularly for cancer of the pelvic organs (ovaries, uterus, and vagina). This screening involves a pelvic examination, including checking for microscopic changes to the surface of your cervix (Pap test). You may be encouraged to have this screening done every 3 years, beginning at age 69.  For women ages 57-65, health care providers may recommend pelvic exams and Pap testing every 3 years, or they may recommend the Pap and pelvic exam, combined with testing for human papilloma virus (HPV), every 5 years. Some types of  HPV increase your risk of cervical cancer. Testing for HPV may also be done on women of any age with unclear Pap test results.  Other health care providers may not recommend any screening for nonpregnant women who are considered low risk for pelvic cancer and who do not have symptoms. Ask your health care provider if a screening pelvic exam is right for you.  If you have had past treatment for cervical cancer or a condition that could lead to cancer, you need Pap tests and screening for cancer for at least 20 years after your treatment. If Pap tests have been discontinued, your risk factors (such as having a new sexual partner) need to be reassessed to determine if screening should resume. Some women have medical problems that increase the chance of getting cervical cancer. In these cases, your health care provider may recommend more frequent screening and Pap tests.  Colorectal Cancer  This type of cancer can be detected and often prevented.  Routine colorectal cancer screening usually begins at 50 years of age and continues through 50 years of age.  Your health care provider may recommend screening at an earlier age if you have risk factors for colon cancer.  Your health care provider may also recommend using home test kits to check for hidden blood in the stool.  A small camera at the end of a tube can be used to examine your colon directly (sigmoidoscopy or colonoscopy). This is done to check for the earliest forms of colorectal cancer.  Routine screening usually begins at age 49.  Direct examination of the colon should be repeated every 5-10 years through 50 years of age. However, you may need to be screened more often if early forms of precancerous polyps or small growths are found.  Skin Cancer  Check your skin from head to toe regularly.  Tell your health care provider about any new moles or changes in moles, especially if there is a change in a mole's shape or color.  Also tell  your health care provider if you have a mole that is larger than the size of a pencil eraser.  Always use sunscreen. Apply sunscreen liberally and repeatedly throughout the day.  Protect yourself by wearing long sleeves, pants, a wide-brimmed hat, and sunglasses whenever you are outside.  Heart disease, diabetes, and high blood pressure  High blood pressure causes heart disease and increases the risk of stroke. High blood pressure is more likely to develop in: ? People who have blood pressure in the high end of the normal range (130-139/85-89 mm Hg). ? People who are overweight or obese. ? People who are African American.  If you are 34-67 years of age, have your blood pressure checked every  3-5 years. If you are 46 years of age or older, have your blood pressure checked every year. You should have your blood pressure measured twice-once when you are at a hospital or clinic, and once when you are not at a hospital or clinic. Record the average of the two measurements. To check your blood pressure when you are not at a hospital or clinic, you can use: ? An automated blood pressure machine at a pharmacy. ? A home blood pressure monitor.  If you are between 18 years and 72 years old, ask your health care provider if you should take aspirin to prevent strokes.  Have regular diabetes screenings. This involves taking a blood sample to check your fasting blood sugar level. ? If you are at a normal weight and have a low risk for diabetes, have this test once every three years after 50 years of age. ? If you are overweight and have a high risk for diabetes, consider being tested at a younger age or more often. Preventing infection Hepatitis B  If you have a higher risk for hepatitis B, you should be screened for this virus. You are considered at high risk for hepatitis B if: ? You were born in a country where hepatitis B is common. Ask your health care provider which countries are considered high  risk. ? Your parents were born in a high-risk country, and you have not been immunized against hepatitis B (hepatitis B vaccine). ? You have HIV or AIDS. ? You use needles to inject street drugs. ? You live with someone who has hepatitis B. ? You have had sex with someone who has hepatitis B. ? You get hemodialysis treatment. ? You take certain medicines for conditions, including cancer, organ transplantation, and autoimmune conditions.  Hepatitis C  Blood testing is recommended for: ? Everyone born from [birth year] through 1963. ? Anyone with known risk factors for hepatitis C.  Sexually transmitted infections (STIs)  You should be screened for sexually transmitted infections (STIs) including gonorrhea and chlamydia if: ? You are sexually active and are younger than 50 years of age. ? You are older than 50 years of age and your health care provider tells you that you are at risk for this type of infection. ? Your sexual activity has changed since you were last screened and you are at an increased risk for chlamydia or gonorrhea. Ask your health care provider if you are at risk.  If you do not have HIV, but are at risk, it may be recommended that you take a prescription medicine daily to prevent HIV infection. This is called pre-exposure prophylaxis (PrEP). You are considered at risk if: ? You are sexually active and do not regularly use condoms or know the HIV status of your partner(s). ? You take drugs by injection. ? You are sexually active with a partner who has HIV.  Talk with your health care provider about whether you are at high risk of being infected with HIV. If you choose to begin PrEP, you should first be tested for HIV. You should then be tested every 3 months for as long as you are taking PrEP. Pregnancy  If you are premenopausal and you may become pregnant, ask your health care provider about preconception counseling.  If you may become pregnant, take 400 to 800 micrograms  (mcg) of folic acid every day.  If you want to prevent pregnancy, talk to your health care provider about birth control (contraception). Osteoporosis and menopause  Osteoporosis is a disease in which the bones lose minerals and strength with aging. This can result in serious bone fractures. Your risk for osteoporosis can be identified using a bone density scan.  If you are 58 years of age or older, or if you are at risk for osteoporosis and fractures, ask your health care provider if you should be screened.  Ask your health care provider whether you should take a calcium or vitamin D supplement to lower your risk for osteoporosis.  Menopause may have certain physical symptoms and risks.  Hormone replacement therapy may reduce some of these symptoms and risks. Talk to your health care provider about whether hormone replacement therapy is right for you. Follow these instructions at home:  Schedule regular health, dental, and eye exams.  Stay current with your immunizations.  Do not use any tobacco products including cigarettes, chewing tobacco, or electronic cigarettes.  If you are pregnant, do not drink alcohol.  If you are breastfeeding, limit how much and how often you drink alcohol.  Limit alcohol intake to no more than 1 drink per day for nonpregnant women. One drink equals 12 ounces of beer, 5 ounces of wine, or 1 ounces of hard liquor.  Do not use street drugs.  Do not share needles.  Ask your health care provider for help if you need support or information about quitting drugs.  Tell your health care provider if you often feel depressed.  Tell your health care provider if you have ever been abused or do not feel safe at home. This information is not intended to replace advice given to you by your health care provider. Make sure you discuss any questions you have with your health care provider. Document Released: 02/09/2011 Document Revised: 01/02/2016 Document Reviewed:  04/30/2015 Elsevier Interactive Patient Education  2018 [place]. Panosh M.D.

## 2018-03-04 ENCOUNTER — Encounter: Payer: Self-pay | Admitting: Internal Medicine

## 2018-03-04 ENCOUNTER — Ambulatory Visit (INDEPENDENT_AMBULATORY_CARE_PROVIDER_SITE_OTHER): Payer: 59 | Admitting: Internal Medicine

## 2018-03-04 VITALS — BP 108/68 | HR 70 | Temp 98.3°F | Ht 64.25 in | Wt 167.3 lb

## 2018-03-04 DIAGNOSIS — G43109 Migraine with aura, not intractable, without status migrainosus: Secondary | ICD-10-CM | POA: Diagnosis not present

## 2018-03-04 DIAGNOSIS — E559 Vitamin D deficiency, unspecified: Secondary | ICD-10-CM | POA: Diagnosis not present

## 2018-03-04 DIAGNOSIS — Z79899 Other long term (current) drug therapy: Secondary | ICD-10-CM | POA: Diagnosis not present

## 2018-03-04 DIAGNOSIS — Z0000 Encounter for general adult medical examination without abnormal findings: Secondary | ICD-10-CM

## 2018-03-04 DIAGNOSIS — E785 Hyperlipidemia, unspecified: Secondary | ICD-10-CM

## 2018-03-04 DIAGNOSIS — W57XXXA Bitten or stung by nonvenomous insect and other nonvenomous arthropods, initial encounter: Secondary | ICD-10-CM | POA: Diagnosis not present

## 2018-03-04 LAB — BASIC METABOLIC PANEL
BUN: 14 mg/dL (ref 6–23)
CALCIUM: 9.7 mg/dL (ref 8.4–10.5)
CO2: 29 meq/L (ref 19–32)
Chloride: 101 mEq/L (ref 96–112)
Creatinine, Ser: 0.72 mg/dL (ref 0.40–1.20)
GFR: 91.25 mL/min (ref >60.00)
GLUCOSE: 99 mg/dL (ref 70–99)
Potassium: 4.4 mEq/L (ref 3.5–5.1)
Sodium: 138 mEq/L (ref 135–145)

## 2018-03-04 LAB — CBC WITH DIFFERENTIAL/PLATELET
BASOS ABS: 0.1 10*3/uL (ref 0.0–0.1)
Basophils Relative: 1 % (ref 0.0–3.0)
Eosinophils Absolute: 0.2 10*3/uL (ref 0.0–0.7)
Eosinophils Relative: 3.1 % (ref 0.0–5.0)
HCT: 43.9 % (ref 36.0–46.0)
Hemoglobin: 15 g/dL (ref 12.0–15.0)
LYMPHS ABS: 1.2 10*3/uL (ref 0.7–4.0)
Lymphocytes Relative: 23.5 % (ref 12.0–46.0)
MCHC: 34.1 g/dL (ref 30.0–36.0)
MCV: 91.1 fl (ref 78.0–100.0)
MONO ABS: 0.5 10*3/uL (ref 0.1–1.0)
MONOS PCT: 9.3 % (ref 3.0–12.0)
NEUTROS PCT: 63.1 % (ref 43.0–77.0)
Neutro Abs: 3.2 10*3/uL (ref 1.4–7.7)
Platelets: 413 10*3/uL — ABNORMAL HIGH (ref 150.0–400.0)
RBC: 4.82 Mil/uL (ref 3.87–5.11)
RDW: 13.6 % (ref 11.5–15.5)
WBC: 5.1 10*3/uL (ref 4.0–10.5)

## 2018-03-04 LAB — LIPID PANEL
Cholesterol: 221 mg/dL — ABNORMAL HIGH (ref 0–200)
HDL: 62.5 mg/dL (ref >39.00)
LDL CALC: 132 mg/dL — AB (ref 0–99)
NONHDL: 158.82
Total CHOL/HDL Ratio: 4
Triglycerides: 136 mg/dL (ref 0.0–149.0)
VLDL: 27.2 mg/dL (ref 0.0–40.0)

## 2018-03-04 LAB — HEPATIC FUNCTION PANEL
ALK PHOS: 42 U/L (ref 39–117)
ALT: 21 U/L (ref 0–35)
AST: 18 U/L (ref 0–37)
Albumin: 4.7 g/dL (ref 3.5–5.2)
BILIRUBIN DIRECT: 0.1 mg/dL (ref 0.0–0.3)
Total Bilirubin: 0.7 mg/dL (ref 0.2–1.2)
Total Protein: 7.4 g/dL (ref 6.0–8.3)

## 2018-03-04 LAB — TSH: TSH: 2.06 u[IU]/mL (ref 0.35–4.50)

## 2018-03-04 LAB — VITAMIN D 25 HYDROXY (VIT D DEFICIENCY, FRACTURES): VITD: 25.28 ng/mL — ABNORMAL LOW (ref 30.00–100.00)

## 2018-03-04 MED ORDER — IBUPROFEN 800 MG PO TABS: ORAL_TABLET | ORAL | 2 refills | Status: DC

## 2018-03-04 NOTE — Patient Instructions (Addendum)
Let us know when need refill  Migraine meds.   Will notify you  of labs when available.   Local care to tick bite   Continue lifestyle intervention healthy eating and exercise .   Health Maintenance, Female Adopting a healthy lifestyle and getting preventive care can go a long way to promote health and wellness. Talk with your health care provider about what schedule of regular examinations is right for you. This is a good chance for you to check in with your provider about disease prevention and staying healthy. In between checkups, there are plenty of things you can do on your own. Experts have done a lot of research about which lifestyle changes and preventive measures are most likely to keep you healthy. Ask your health care provider for more information. Weight and diet Eat a healthy diet  Be sure to include plenty of vegetables, fruits, low-fat dairy products, and lean protein.  Do not eat a lot of foods high in solid fats, added sugars, or salt.  Get regular exercise. This is one of the most important things you can do for your health. ? Most adults should exercise for at least 150 minutes each week. The exercise should increase your heart rate and make you sweat (moderate-intensity exercise). ? Most adults should also do strengthening exercises at least twice a week. This is in addition to the moderate-intensity exercise.  Maintain a healthy weight  Body mass index (BMI) is a measurement that can be used to identify possible weight problems. It estimates body fat based on height and weight. Your health care provider can help determine your BMI and help you achieve or maintain a healthy weight.  For females 74 years of age and older: ? A BMI below 18.5 is considered underweight. ? A BMI of 18.5 to 24.9 is normal. ? A BMI of 25 to 29.9 is considered overweight. ? A BMI of 30 and above is considered obese.  Watch levels of cholesterol and blood lipids  You should start having  your blood tested for lipids and cholesterol at 50 years of age, then have this test every 5 years.  You may need to have your cholesterol levels checked more often if: ? Your lipid or cholesterol levels are high. ? You are older than 50 years of age. ? You are at high risk for heart disease.  Cancer screening Lung Cancer  Lung cancer screening is recommended for adults 44-75 years old who are at high risk for lung cancer because of a history of smoking.  A yearly low-dose CT scan of the lungs is recommended for people who: ? Currently smoke. ? Have quit within the past 15 years. ? Have at least a 30-pack-year history of smoking. A pack year is smoking an average of one pack of cigarettes a day for 1 year.  Yearly screening should continue until it has been 15 years since you quit.  Yearly screening should stop if you develop a health problem that would prevent you from having lung cancer treatment.  Breast Cancer  Practice breast self-awareness. This means understanding how your breasts normally appear and feel.  It also means doing regular breast self-exams. Let your health care provider know about any changes, no matter how small.  If you are in your 20s or 30s, you should have a clinical breast exam (CBE) by a health care provider every 1-3 years as part of a regular health exam.  If you are 55 or older, have a  CBE every year. Also consider having a breast X-ray (mammogram) every year.  If you have a family history of breast cancer, talk to your health care provider about genetic screening.  If you are at high risk for breast cancer, talk to your health care provider about having an MRI and a mammogram every year.  Breast cancer gene (BRCA) assessment is recommended for women who have family members with BRCA-related cancers. BRCA-related cancers include: ? Breast. ? Ovarian. ? Tubal. ? Peritoneal cancers.  Results of the assessment will determine the need for genetic  counseling and BRCA1 and BRCA2 testing.  Cervical Cancer Your health care provider may recommend that you be screened regularly for cancer of the pelvic organs (ovaries, uterus, and vagina). This screening involves a pelvic examination, including checking for microscopic changes to the surface of your cervix (Pap test). You may be encouraged to have this screening done every 3 years, beginning at age 77.  For women ages 60-65, health care providers may recommend pelvic exams and Pap testing every 3 years, or they may recommend the Pap and pelvic exam, combined with testing for human papilloma virus (HPV), every 5 years. Some types of HPV increase your risk of cervical cancer. Testing for HPV may also be done on women of any age with unclear Pap test results.  Other health care providers may not recommend any screening for nonpregnant women who are considered low risk for pelvic cancer and who do not have symptoms. Ask your health care provider if a screening pelvic exam is right for you.  If you have had past treatment for cervical cancer or a condition that could lead to cancer, you need Pap tests and screening for cancer for at least 20 years after your treatment. If Pap tests have been discontinued, your risk factors (such as having a new sexual partner) need to be reassessed to determine if screening should resume. Some women have medical problems that increase the chance of getting cervical cancer. In these cases, your health care provider may recommend more frequent screening and Pap tests.  Colorectal Cancer  This type of cancer can be detected and often prevented.  Routine colorectal cancer screening usually begins at 50 years of age and continues through 50 years of age.  Your health care provider may recommend screening at an earlier age if you have risk factors for colon cancer.  Your health care provider may also recommend using home test kits to check for hidden blood in the  stool.  A small camera at the end of a tube can be used to examine your colon directly (sigmoidoscopy or colonoscopy). This is done to check for the earliest forms of colorectal cancer.  Routine screening usually begins at age 68.  Direct examination of the colon should be repeated every 5-10 years through 50 years of age. However, you may need to be screened more often if early forms of precancerous polyps or small growths are found.  Skin Cancer  Check your skin from head to toe regularly.  Tell your health care provider about any new moles or changes in moles, especially if there is a change in a mole's shape or color.  Also tell your health care provider if you have a mole that is larger than the size of a pencil eraser.  Always use sunscreen. Apply sunscreen liberally and repeatedly throughout the day.  Protect yourself by wearing long sleeves, pants, a wide-brimmed hat, and sunglasses whenever you are outside.  Heart disease,  diabetes, and high blood pressure  High blood pressure causes heart disease and increases the risk of stroke. High blood pressure is more likely to develop in: ? People who have blood pressure in the high end of the normal range (130-139/85-89 mm Hg). ? People who are overweight or obese. ? People who are African American.  If you are 35-41 years of age, have your blood pressure checked every 3-5 years. If you are 68 years of age or older, have your blood pressure checked every year. You should have your blood pressure measured twice-once when you are at a hospital or clinic, and once when you are not at a hospital or clinic. Record the average of the two measurements. To check your blood pressure when you are not at a hospital or clinic, you can use: ? An automated blood pressure machine at a pharmacy. ? A home blood pressure monitor.  If you are between 62 years and 22 years old, ask your health care provider if you should take aspirin to prevent  strokes.  Have regular diabetes screenings. This involves taking a blood sample to check your fasting blood sugar level. ? If you are at a normal weight and have a low risk for diabetes, have this test once every three years after 50 years of age. ? If you are overweight and have a high risk for diabetes, consider being tested at a younger age or more often. Preventing infection Hepatitis B  If you have a higher risk for hepatitis B, you should be screened for this virus. You are considered at high risk for hepatitis B if: ? You were born in a country where hepatitis B is common. Ask your health care provider which countries are considered high risk. ? Your parents were born in a high-risk country, and you have not been immunized against hepatitis B (hepatitis B vaccine). ? You have HIV or AIDS. ? You use needles to inject street drugs. ? You live with someone who has hepatitis B. ? You have had sex with someone who has hepatitis B. ? You get hemodialysis treatment. ? You take certain medicines for conditions, including cancer, organ transplantation, and autoimmune conditions.  Hepatitis C  Blood testing is recommended for: ? Everyone born from [birth year] through 1963. ? Anyone with known risk factors for hepatitis C.  Sexually transmitted infections (STIs)  You should be screened for sexually transmitted infections (STIs) including gonorrhea and chlamydia if: ? You are sexually active and are younger than 50 years of age. ? You are older than 49 years of age and your health care provider tells you that you are at risk for this type of infection. ? Your sexual activity has changed since you were last screened and you are at an increased risk for chlamydia or gonorrhea. Ask your health care provider if you are at risk.  If you do not have HIV, but are at risk, it Michaelis be recommended that you take a prescription medicine daily to prevent HIV infection. This is called pre-exposure prophylaxis  (PrEP). You are considered at risk if: ? You are sexually active and do not regularly use condoms or know the HIV status of your partner(s). ? You take drugs by injection. ? You are sexually active with a partner who has HIV.  Talk with your health care provider about whether you are at high risk of being infected with HIV. If you choose to begin PrEP, you should first be tested for HIV. You should  then be tested every 3 months for as long as you are taking PrEP. Pregnancy  If you are premenopausal and you may become pregnant, ask your health care provider about preconception counseling.  If you may become pregnant, take 400 to 800 micrograms (mcg) of folic acid every day.  If you want to prevent pregnancy, talk to your health care provider about birth control (contraception). Osteoporosis and menopause  Osteoporosis is a disease in which the bones lose minerals and strength with aging. This can result in serious bone fractures. Your risk for osteoporosis can be identified using a bone density scan.  If you are 40 years of age or older, or if you are at risk for osteoporosis and fractures, ask your health care provider if you should be screened.  Ask your health care provider whether you should take a calcium or vitamin D supplement to lower your risk for osteoporosis.  Menopause may have certain physical symptoms and risks.  Hormone replacement therapy may reduce some of these symptoms and risks. Talk to your health care provider about whether hormone replacement therapy is right for you. Follow these instructions at home:  Schedule regular health, dental, and eye exams.  Stay current with your immunizations.  Do not use any tobacco products including cigarettes, chewing tobacco, or electronic cigarettes.  If you are pregnant, do not drink alcohol.  If you are breastfeeding, limit how much and how often you drink alcohol.  Limit alcohol intake to no more than 1 drink per day for  nonpregnant women. One drink equals 12 ounces of beer, 5 ounces of wine, or 1 ounces of hard liquor.  Do not use street drugs.  Do not share needles.  Ask your health care provider for help if you need support or information about quitting drugs.  Tell your health care provider if you often feel depressed.  Tell your health care provider if you have ever been abused or do not feel safe at home. This information is not intended to replace advice given to you by your health care provider. Make sure you discuss any questions you have with your health care provider. Document Released: 02/09/2011 Document Revised: 01/02/2016 Document Reviewed: 04/30/2015 Elsevier Interactive Patient Education  [employer].

## 2018-05-13 DIAGNOSIS — H524 Presbyopia: Secondary | ICD-10-CM | POA: Diagnosis not present

## 2018-05-13 DIAGNOSIS — H5213 Myopia, bilateral: Secondary | ICD-10-CM | POA: Diagnosis not present

## 2018-05-13 DIAGNOSIS — H527 Unspecified disorder of refraction: Secondary | ICD-10-CM | POA: Diagnosis not present

## 2018-05-13 DIAGNOSIS — H52223 Regular astigmatism, bilateral: Secondary | ICD-10-CM | POA: Diagnosis not present

## 2018-10-09 DIAGNOSIS — Z23 Encounter for immunization: Secondary | ICD-10-CM | POA: Diagnosis not present

## 2019-02-07 NOTE — Progress Notes (Signed)
Virtual Visit via Video Note  I connected with@ on 02/08/19 at  3:00 PM EDT by a video enabled telemedicine application and verified that I am speaking with the correct person using two identifiers. Location patient: home Location provider:work  office Persons participating in the virtual visit: patient, provider  [place] national recommendations  regarding COVID 19 pandemic   video visit is advised over in office visit for this patient.  Patient aware  of the limitations of evaluation and management by telemedicine and  availability of in person appointments. and agreed to proceed.   HPI: Kendra Fox presents for video visit Last OV was July 2019  cpx  would like to try phentermine again  Was on a few years ago and may have causes sleep ditrubance  But took some left ober and seemed to help over the last 2 days. At home not working since April and exercising 3+ miles per day and now weights   Some dietary indiscretions but trying to track on my fitness pal  But not having success at this time. No drink calories   Some snacking an issues.   No cv pulm sx  Not sure of bp but had been good  weight about 162 range is 5 5.5   On allergy flonase and  Allegra.   ROS: See pertinent positives and negatives per HPI. No cp sob cv sx   Past Medical History:  Diagnosis Date  . Allergic rhinitis   . Bilateral carpal tunnel syndrome 02/26/2016  . Environmental and seasonal allergies    Ragweed, grasses, dust mite, mold  . Fracture    left ankle  . History of DVT (deep vein thrombosis)    2006   sp ankle fracture  immobilization   . Increased severity of headaches     Past Surgical History:  Procedure Laterality Date  . L ankle frac Left 2015  . TOOTH EXTRACTION    . WRIST FRACTURE SURGERY Right 2015   Dr. Amedeo Plenty    Family History  Problem Relation Age of Onset  . Migraines Mother   . COPD Father        smoker  . Parkinsonism Father     Social History   Tobacco Use  . Smoking  status: Former Research scientist (life sciences)  . Smokeless tobacco: Never Used  . Tobacco comment: Quit in 2004  Substance Use Topics  . Alcohol use: Yes    Alcohol/week: 6.0 standard drinks    Types: 6 Glasses of wine per week    Comment: social  . Drug use: No      Current Outpatient Medications:  .  ALPRAZolam (XANAX) 0.25 MG tablet, Take 1 tablet (0.25 mg total) by mouth at bedtime as needed., Disp: 30 tablet, Rfl: 1 .  b complex vitamins tablet, Take 1 tablet by mouth daily., Disp: , Rfl:  .  fexofenadine (ALLEGRA) 30 MG tablet, Take 30 mg by mouth 2 (two) times daily., Disp: , Rfl:  .  fluticasone (FLONASE) 50 MCG/ACT nasal spray, Place into both nostrils daily., Disp: , Rfl:  .  ibuprofen (ADVIL,MOTRIN) 800 MG tablet, TAKE 1 TABLET (800 MG TOTAL) BY MOUTH EVERY 8 (EIGHT) HOURS AS NEEDED., Disp: 30 tablet, Rfl: 2 .  MULTIPLE VITAMIN PO, Take by mouth daily. , Disp: , Rfl:  .  phentermine 15 MG capsule, Take 1 capsule (15 mg total) by mouth every morning., Disp: 30 capsule, Rfl: 0 .  SUMAtriptan (IMITREX) 25 MG tablet, Take 1 tablet at the onset of  migraine. If headache persist can take an additional tablet. Not to exceed 2 tablets in 24 hours., Disp: 10 tablet, Rfl: 11  EXAM: BP Readings from Last 3 Encounters:  03/04/18 108/68  12/02/17 109/74  09/01/16 100/70    VITALS per patient if applicable:  GENERAL: alert, oriented, appears well and in no acute distress HEENT: atraumatic, conjunttiva clear, no obvious abnormalities on inspection of external nose and ears NECK: normal movements of the head and neck LUNGS: on inspection no signs of respiratory distress, breathing rate appears normal, no obvious gross SOB, gasping or wheezing CV: no obvious cyanosis MS: moves all visible extremities without noticeable abnormality PSYCH/NEURO: pleasant and cooperative, no obvious depression or anxiety, speech and thought processing grossly intact Lab Results  Component Value Date   WBC 5.1 03/04/2018    HGB 15.0 03/04/2018   HCT 43.9 03/04/2018   PLT 413.0 (H) 03/04/2018   GLUCOSE 99 03/04/2018   CHOL 221 (H) 03/04/2018   TRIG 136.0 03/04/2018   HDL 62.50 03/04/2018   LDLDIRECT 121.0 04/25/2013   LDLCALC 132 (H) 03/04/2018   ALT 21 03/04/2018   AST 18 03/04/2018   NA 138 03/04/2018   K 4.4 03/04/2018   CL 101 03/04/2018   CREATININE 0.72 03/04/2018   BUN 14 03/04/2018   CO2 29 03/04/2018   TSH 2.06 03/04/2018    ASSESSMENT AND PLAN:  Discussed the following assessment and plan:    ICD-10-CM   1. Medication management  S14.239 Basic metabolic panel    CBC with Differential/Platelet    Hepatic function panel    Lipid panel    TSH  2. Hyperlipidemia, unspecified hyperlipidemia type  R32.0 Basic metabolic panel    CBC with Differential/Platelet    Hepatic function panel    Lipid panel    TSH  3. Overweight  E33.4 Basic metabolic panel    CBC with Differential/Platelet    Hepatic function panel    Lipid panel    TSH  4. blood work for preventive health examination  D56.86 Basic metabolic panel    CBC with Differential/Platelet    Hepatic function panel    Lipid panel    TSH   Advise continued tracking  Refill med plan cpx and lab pre visit in 1 month or thereabouts   Disc  Strategies.  Total visit 20mins > 50% spent counseling and coordinating care as indicated in above note and in instructions to patient .   Counseled.   Expectant management and discussion of plan and treatment with opportunity to ask questions and all were answered. The patient agreed with the plan and demonstrated an understanding of the instructions.   Advised to call back or seek an in-person evaluation if  having  further concerns .    Shanon Ace, MD

## 2019-02-08 ENCOUNTER — Encounter: Payer: Self-pay | Admitting: Internal Medicine

## 2019-02-08 ENCOUNTER — Other Ambulatory Visit: Payer: Self-pay

## 2019-02-08 ENCOUNTER — Ambulatory Visit (INDEPENDENT_AMBULATORY_CARE_PROVIDER_SITE_OTHER): Payer: 59 | Admitting: Internal Medicine

## 2019-02-08 ENCOUNTER — Telehealth: Payer: Self-pay

## 2019-02-08 DIAGNOSIS — Z79899 Other long term (current) drug therapy: Secondary | ICD-10-CM | POA: Diagnosis not present

## 2019-02-08 DIAGNOSIS — E663 Overweight: Secondary | ICD-10-CM | POA: Diagnosis not present

## 2019-02-08 DIAGNOSIS — Z0000 Encounter for general adult medical examination without abnormal findings: Secondary | ICD-10-CM

## 2019-02-08 DIAGNOSIS — E785 Hyperlipidemia, unspecified: Secondary | ICD-10-CM | POA: Diagnosis not present

## 2019-02-08 MED ORDER — PHENTERMINE HCL 15 MG PO CAPS: 15.0000 mg | ORAL_CAPSULE | ORAL | 0 refills | Status: DC

## 2019-02-08 NOTE — Telephone Encounter (Signed)
Unable to leave vm for patient to schedule cpe and lab appt

## 2019-03-08 ENCOUNTER — Other Ambulatory Visit: Payer: Self-pay

## 2019-03-08 ENCOUNTER — Other Ambulatory Visit (INDEPENDENT_AMBULATORY_CARE_PROVIDER_SITE_OTHER): Payer: 59

## 2019-03-08 DIAGNOSIS — Z79899 Other long term (current) drug therapy: Secondary | ICD-10-CM

## 2019-03-08 DIAGNOSIS — E663 Overweight: Secondary | ICD-10-CM | POA: Diagnosis not present

## 2019-03-08 DIAGNOSIS — E785 Hyperlipidemia, unspecified: Secondary | ICD-10-CM

## 2019-03-08 DIAGNOSIS — Z0000 Encounter for general adult medical examination without abnormal findings: Secondary | ICD-10-CM | POA: Diagnosis not present

## 2019-03-08 LAB — CBC WITH DIFFERENTIAL/PLATELET
Basophils Absolute: 0 10*3/uL (ref 0.0–0.1)
Basophils Relative: 0.8 % (ref 0.0–3.0)
Eosinophils Absolute: 0.2 10*3/uL (ref 0.0–0.7)
Eosinophils Relative: 5 % (ref 0.0–5.0)
HCT: 45.7 % (ref 36.0–46.0)
Hemoglobin: 15.3 g/dL — ABNORMAL HIGH (ref 12.0–15.0)
Lymphocytes Relative: 25.1 % (ref 12.0–46.0)
Lymphs Abs: 1.2 10*3/uL (ref 0.7–4.0)
MCHC: 33.4 g/dL (ref 30.0–36.0)
MCV: 92.5 fl (ref 78.0–100.0)
Monocytes Absolute: 0.4 10*3/uL (ref 0.1–1.0)
Monocytes Relative: 8.7 % (ref 3.0–12.0)
Neutro Abs: 2.9 10*3/uL (ref 1.4–7.7)
Neutrophils Relative %: 60.4 % (ref 43.0–77.0)
Platelets: 354 10*3/uL (ref 150.0–400.0)
RBC: 4.94 Mil/uL (ref 3.87–5.11)
RDW: 14 % (ref 11.5–15.5)
WBC: 4.8 10*3/uL (ref 4.0–10.5)

## 2019-03-08 LAB — BASIC METABOLIC PANEL
BUN: 13 mg/dL (ref 6–23)
CO2: 30 mEq/L (ref 19–32)
Calcium: 9.8 mg/dL (ref 8.4–10.5)
Chloride: 101 mEq/L (ref 96–112)
Creatinine, Ser: 0.71 mg/dL (ref 0.40–1.20)
GFR: 86.89 mL/min (ref >60.00)
Glucose, Bld: 89 mg/dL (ref 70–99)
Potassium: 4.6 mEq/L (ref 3.5–5.1)
Sodium: 139 mEq/L (ref 135–145)

## 2019-03-08 LAB — HEPATIC FUNCTION PANEL
ALT: 17 U/L (ref 0–35)
AST: 18 U/L (ref 0–37)
Albumin: 4.6 g/dL (ref 3.5–5.2)
Alkaline Phosphatase: 49 U/L (ref 39–117)
Bilirubin, Direct: 0.1 mg/dL (ref 0.0–0.3)
Total Bilirubin: 0.8 mg/dL (ref 0.2–1.2)
Total Protein: 7.1 g/dL (ref 6.0–8.3)

## 2019-03-08 LAB — LIPID PANEL
Cholesterol: 241 mg/dL — ABNORMAL HIGH (ref 0–200)
HDL: 62.9 mg/dL (ref >39.00)
LDL Cholesterol: 147 mg/dL — ABNORMAL HIGH (ref 0–99)
NonHDL: 177.93
Total CHOL/HDL Ratio: 4
Triglycerides: 153 mg/dL — ABNORMAL HIGH (ref 0.0–149.0)
VLDL: 30.6 mg/dL (ref 0.0–40.0)

## 2019-03-08 LAB — TSH: TSH: 3.15 u[IU]/mL (ref 0.35–4.50)

## 2019-03-14 NOTE — Progress Notes (Signed)
Chief Complaint  Patient presents with  . Annual Exam    no concerns     HPI: Patient  Kendra Fox  53 y.o. comes in today for Preventive Health Care visit  And med evaluation   HAs  Takes ocass med Has ocass  3-4 x per year.  Triggers weather  Overweight etc  Taking phentermine  And seems to help some   On some days taking 30 mg  Per day   No se  Stable weight and some progress  Initial weight  168   Saw lipid up  Began working on  exericse   Refill ibuprofen refills expires taking ocass     Health Maintenance  Topic Date Due  . HIV Screening  06/30/1983  . TETANUS/TDAP  08/10/2014  . MAMMOGRAM  06/29/2018  . COLONOSCOPY  06/29/2018  . INFLUENZA VACCINE  03/11/2019  . PAP SMEAR-Modifier  10/08/2020   Health Maintenance Review LIFESTYLE:  Exercise:  Walking every day  3+ miles  Tobacco/ETS: no Alcohol:   ocass Sugar beverages: not really .  Sleep:8 hours  Drug use: no HH of  2  2 pets  Work: laid off.  Since April.  Husband is employed  No family hx of colon cancer .  Mom to move down from [place]  To [place]   ROS:  GEN/ HEENT: No fever, significant weight changes sweats headaches vision problems hearing changes, CV/ PULM; No chest pain shortness of breath cough, syncope,edema  change in exercise tolerance. GI /GU: No adominal pain, vomiting, change in bowel habits. No blood in the stool. No significant GU symptoms. SKIN/HEME: ,no acute skin rashes suspicious lesions or bleeding. No lymphadenopathy, nodules, masses.  NEURO/ PSYCH:  No neurologic signs such as weakness numbness. No depression anxiety. IMM/ Allergy: No unusual infections.  Allergy .   REST of 12 system review negative except as per HPI   Past Medical History:  Diagnosis Date  . Allergic rhinitis   . Bilateral carpal tunnel syndrome 02/26/2016  . Environmental and seasonal allergies    Ragweed, grasses, dust mite, mold  . Fracture    left ankle  . History of DVT (deep vein  thrombosis)    2006   sp ankle fracture  immobilization   . Increased severity of headaches     Past Surgical History:  Procedure Laterality Date  . L ankle frac Left 2015  . TOOTH EXTRACTION    . WRIST FRACTURE SURGERY Right 2015   Dr. Amedeo Plenty    Family History  Problem Relation Age of Onset  . Migraines Mother   . COPD Father        smoker  . Parkinsonism Father     Social History   Socioeconomic History  . Marital status: Married    Spouse name: [name]  . Number of children: 0  . Years of education: College  . Highest education level: Not on file  Occupational History    Employer: [employer]  Social Needs  . Financial resource strain: Not on file  . Food insecurity    Worry: Not on file    Inability: Not on file  . Transportation needs    Medical: Not on file    Non-medical: Not on file  Tobacco Use  . Smoking status: Former Research scientist (life sciences)  . Smokeless tobacco: Never Used  . Tobacco comment: Quit in 2004  Substance and Sexual Activity  . Alcohol use: Yes    Alcohol/week: 6.0 standard drinks  Types: 6 Glasses of wine per week    Comment: social  . Drug use: No  . Sexual activity: Not on file  Lifestyle  . Physical activity    Days per week: Not on file    Minutes per session: Not on file  . Stress: Not on file  Relationships  . Social Herbalist on phone: Not on file    Gets together: Not on file    Attends religious service: Not on file    Active member of club or organization: Not on file    Attends meetings of clubs or organizations: Not on file    Relationship status: Not on file  Other Topics Concern  . Not on file  Social History Narrative   Patient is married [name])   Patient works at [employer].   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   Married  HHof 2       Regular exercise-no   Vegan soy milk     neg tobacco 1-2 tea    8 hours sleep    Patient is  right-handed.   Patient drinks 2-3 cups of coffee daily.   Patient has a college education.    Outpatient Medications Prior to Visit  Medication Sig Dispense Refill  . ALPRAZolam (XANAX) 0.25 MG tablet Take 1 tablet (0.25 mg total) by mouth at bedtime as needed. 30 tablet 1  . b complex vitamins tablet Take 1 tablet by mouth daily.    . fexofenadine (ALLEGRA) 30 MG tablet Take 30 mg by mouth 2 (two) times daily.    . fluticasone (FLONASE) 50 MCG/ACT nasal spray Place into both nostrils daily.    . MULTIPLE VITAMIN PO Take by mouth daily.     . SUMAtriptan (IMITREX) 25 MG tablet Take 1 tablet at the onset of migraine. If headache persist can take an additional tablet. Not to exceed 2 tablets in 24 hours. 10 tablet 11  . ibuprofen (ADVIL,MOTRIN) 800 MG tablet TAKE 1 TABLET (800 MG TOTAL) BY MOUTH EVERY 8 (EIGHT) HOURS AS NEEDED. 30 tablet 2  . phentermine 15 MG capsule Take 1 capsule (15 mg total) by mouth every morning. 30 capsule 0   No facility-administered medications prior to visit.      EXAM:  BP 116/68 (BP Location: Right Arm, Patient Position: Sitting, Cuff Size: Normal)   Pulse 87   Temp 98.7 F (37.1 C) (Temporal)   Ht 5\' 4"  (1.626 m)   Wt 164 lb 6.4 oz (74.6 kg)   SpO2 96%   BMI 28.22 kg/m   Body mass index is 28.22 kg/m. Wt Readings from Last 3 Encounters:  03/15/19 164 lb 6.4 oz (74.6 kg)  03/04/18 167 lb 4.8 oz (75.9 kg)  12/02/17 168 lb 3.2 oz (76.3 kg)    Physical Exam: Vital signs reviewed GBT:DVVO is a well-developed well-nourished alert cooperative    who appearsr stated age in no acute distress.  HEENT: normocephalic atraumatic , Eyes: PERRL EOM's full, conjunctiva clear, Nares: paten,t no deformity discharge or tenderness., Ears: no deformity EAC's clear TMs with normal landmarks. Mouth: masked deferred  NECK: supple without masses, thyromegaly or bruits.promonet left ac carotid   CHEST/PULM:  Clear to auscultation and percussion breath sounds equal no  wheeze , rales or rhonchi. No chest wall deformities or tenderness. Breast: deferred  Declined  CV: PMI is nondisplaced, S1 S2 no gallops, murmurs,  rubs. Peripheral pulses are full without delay.No JVD .  ABDOMEN: Bowel sounds normal nontender  No guard or rebound, no hepato splenomegal no CVA tenderness.  No hernia. Extremtities:  No clubbing cyanosis or edema, no acute joint swelling or redness no focal atrophy NEURO:  Oriented x3, cranial nerves 3-12 appear to be intact, no obvious focal weakness,gait within normal limits no abnormal reflexes or asymmetrical SKIN: No acute rashes normal turgor, color, no bruising or petechiae. PSYCH: Oriented, good eye contact, no obvious depression anxiety, cognition and judgment appear normal. LN: no cervical axillary inguinal adenopathy  Lab Results  Component Value Date   WBC 4.8 03/08/2019   HGB 15.3 (H) 03/08/2019   HCT 45.7 03/08/2019   PLT 354.0 03/08/2019   GLUCOSE 89 03/08/2019   CHOL 241 (H) 03/08/2019   TRIG 153.0 (H) 03/08/2019   HDL 62.90 03/08/2019   LDLDIRECT 121.0 04/25/2013   LDLCALC 147 (H) 03/08/2019   ALT 17 03/08/2019   AST 18 03/08/2019   NA 139 03/08/2019   K 4.6 03/08/2019   CL 101 03/08/2019   CREATININE 0.71 03/08/2019   BUN 13 03/08/2019   CO2 30 03/08/2019   TSH 3.15 03/08/2019    BP Readings from Last 3 Encounters:  03/15/19 116/68  03/04/18 108/68  12/02/17 109/74    Lab results reviewed with patient   ASSESSMENT AND PLAN:  Discussed the following assessment and plan:    ICD-10-CM   1. Visit for preventive health examination  Z00.00   2. Hyperlipidemia, unspecified hyperlipidemia type  E78.5 Lipid panel   lsi and can fu lab in 4-6 mos or yearly   3. Medication management  Z79.899   4. Encounter for weight management  Z76.89    benefit more than risk of med at this time  colon cancer  er screening disc  Will check in to cologuard and let us know  Trial 30 mg phentermine with caution and rov virtual  in about 6 weeks or as needed  hopefully will get the lipid panel looking better  Also   Patient Care Team: , Standley Brooking, MD as PCP - General Marylynn Pearson, MD as Consulting Physician (Obstetrics and Gynecology) Danella Sensing, MD as Consulting Physician (Dermatology) Roseanne Kaufman, MD as Consulting Physician (Orthopedic Surgery) Dohmeier, Asencion Partridge, MD as Consulting Physician (Neurology) Patient Instructions  Continue lifestyle intervention healthy eating and exercise . As planned  Trial 30 mg phentermine  But dont take too late in day .   Can recheck lipid panel after 3-6 months of intervention.   Plan rov virtual visit in  About 6 weeks or  thereabouts in regard to the phentermine.   Check in to cologuard screening and if covered by insurance  fpr routine colon cancer screening and let us know  So we can order this or other testing.    Colorectal Cancer Screening  Colorectal cancer screening is a group of tests that are used to check for colorectal cancer before symptoms develop. Colorectal refers to the colon and rectum. The colon and rectum are located at the end of the digestive tract and carry bowel movements out of the body. Who should have screening? All adults starting at age 74 until age 61 should have screening. Your health care provider may recommend screening at age 68. You will have tests every 1-10 years, depending on your results and the type of screening test. You may have screening tests starting at an earlier age, or more frequently than other people, if  you have any of the following risk factors:  A personal or family history of colorectal cancer or abnormal growths (polyps).  Inflammatory bowel disease, such as ulcerative colitis or Crohn's disease.  A history of having radiation treatment to the abdomen or pelvic area for cancer.  Colorectal cancer symptoms, such as changes in bowel habits or blood in your stool.  A type of colon cancer syndrome that is  passed from parent to child (hereditary), such as: ? Lynch syndrome. ? Familial adenomatous polyposis. ? Turcot syndrome. ? Peutz-Jeghers syndrome. Screening recommendations for adults who are 66-55 years old vary depending on health. How is screening done? There are several types of colorectal screening tests. You may have one or more of the following:  Guaiac-based fecal occult blood testing. For this test, a stool (feces) sample is checked for hidden (occult) blood, which could be a sign of colorectal cancer.  Fecal immunochemical test (FIT). For this test, a stool sample is checked for blood, which could be a sign of colorectal cancer.  Stool DNA test. For this test, a stool sample is checked for blood and changes in DNA that could lead to colorectal cancer.  Sigmoidoscopy. During this test, a thin, flexible tube with a camera on the end (sigmoidoscope) is used to examine the rectum and the lower colon.  Colonoscopy. During this test, a long, flexible tube with a camera on the end (colonoscope) is used to examine the entire colon and rectum. With a colonoscopy, it is possible to take a sample of tissue (biopsy) and remove small polyps during the test.  Virtual colonoscopy. Instead of a colonoscope, this type of colonoscopy uses X-rays (CT scan) and computers to produce images of the colon and rectum. What are the benefits of screening? Screening reduces your risk for colorectal cancer and can help identify cancer at an early stage, when the cancer can be removed or treated more easily. It is common for polyps to form in the lining of the colon, especially as you age. These polyps may be cancerous or become cancerous over time. Screening can identify these polyps. What are the risks of screening? Each screening test may have different risks.  Stool sample tests have fewer risks than other types of screening tests. However, you may need more tests to confirm results from a stool sample  test.  Screening tests that involve X-rays expose you to low levels of radiation, which may slightly increase your cancer risk. The benefit of detecting cancer outweighs the slight increase in risk.  Screening tests such as sigmoidoscopy and colonoscopy may place you at risk for bleeding, intestinal damage, infection, or a reaction to medicines given during the exam. Talk with your health care provider to understand your risk for colorectal cancer and to make a screening plan that is right for you. Questions to ask your health care provider  When should I start colorectal cancer screening?  What is my risk for colorectal cancer?  How often do I need screening?  Which screening tests do I need?  How do I get my test results?  What do my results mean? Where to find more information Learn more about colorectal cancer screening from:  The [place]: www.cancer.org  The [employer]: www.cancer.gov Summary  Colorectal cancer screening is a group of tests used to check for colorectal cancer before symptoms develop.  Screening reduces your risk for colorectal cancer and can help identify cancer at an early stage, when the cancer can be  removed or treated more easily.  All adults starting at age 82 until age 7 should have screening. Your health care provider may recommend screening at age 26.  You may have screening tests starting at an earlier age, or more frequently than other people, if you have certain risk factors.  Talk with your health care provider to understand your risk for colorectal cancer and to make a screening plan that is right for you. This information is not intended to replace advice given to you by your health care provider. Make sure you discuss any questions you have with your health care provider. Document Released: 01/14/2010 Document Revised: 11/16/2018 Document Reviewed: 04/28/2017 Elsevier Patient Education  2020 [place]  M.D.

## 2019-03-15 ENCOUNTER — Ambulatory Visit (INDEPENDENT_AMBULATORY_CARE_PROVIDER_SITE_OTHER): Payer: 59 | Admitting: Internal Medicine

## 2019-03-15 ENCOUNTER — Encounter: Payer: Self-pay | Admitting: Internal Medicine

## 2019-03-15 VITALS — BP 116/68 | HR 87 | Temp 98.7°F | Ht 64.0 in | Wt 164.4 lb

## 2019-03-15 DIAGNOSIS — Z79899 Other long term (current) drug therapy: Secondary | ICD-10-CM

## 2019-03-15 DIAGNOSIS — Z0000 Encounter for general adult medical examination without abnormal findings: Secondary | ICD-10-CM

## 2019-03-15 DIAGNOSIS — Z7689 Persons encountering health services in other specified circumstances: Secondary | ICD-10-CM | POA: Diagnosis not present

## 2019-03-15 DIAGNOSIS — E785 Hyperlipidemia, unspecified: Secondary | ICD-10-CM | POA: Diagnosis not present

## 2019-03-15 MED ORDER — PHENTERMINE HCL 30 MG PO CAPS: 30.0000 mg | ORAL_CAPSULE | ORAL | 1 refills | Status: DC

## 2019-03-15 MED ORDER — IBUPROFEN 800 MG PO TABS: ORAL_TABLET | ORAL | 1 refills | Status: DC

## 2019-03-15 NOTE — Patient Instructions (Signed)
Continue lifestyle intervention healthy eating and exercise . As planned  Trial 30 mg phentermine  But dont take too late in day .   Can recheck lipid panel after 3-6 months of intervention.   Plan rov virtual visit in  About 6 weeks or  thereabouts in regard to the phentermine.   Check in to cologuard screening and if covered by insurance  fpr routine colon cancer screening and let us know  So we can order this or other testing.    Colorectal Cancer Screening  Colorectal cancer screening is a group of tests that are used to check for colorectal cancer before symptoms develop. Colorectal refers to the colon and rectum. The colon and rectum are located at the end of the digestive tract and carry bowel movements out of the body. Who should have screening? All adults starting at age 21 until age 28 should have screening. Your health care provider may recommend screening at age 45. You will have tests every 1-10 years, depending on your results and the type of screening test. You may have screening tests starting at an earlier age, or more frequently than other people, if you have any of the following risk factors:  A personal or family history of colorectal cancer or abnormal growths (polyps).  Inflammatory bowel disease, such as ulcerative colitis or Crohn's disease.  A history of having radiation treatment to the abdomen or pelvic area for cancer.  Colorectal cancer symptoms, such as changes in bowel habits or blood in your stool.  A type of colon cancer syndrome that is passed from parent to child (hereditary), such as: ? Lynch syndrome. ? Familial adenomatous polyposis. ? Turcot syndrome. ? Peutz-Jeghers syndrome. Screening recommendations for adults who are 75-69 years old vary depending on health. How is screening done? There are several types of colorectal screening tests. You may have one or more of the following:  Guaiac-based fecal occult blood testing. For this test, a stool  (feces) sample is checked for hidden (occult) blood, which could be a sign of colorectal cancer.  Fecal immunochemical test (FIT). For this test, a stool sample is checked for blood, which could be a sign of colorectal cancer.  Stool DNA test. For this test, a stool sample is checked for blood and changes in DNA that could lead to colorectal cancer.  Sigmoidoscopy. During this test, a thin, flexible tube with a camera on the end (sigmoidoscope) is used to examine the rectum and the lower colon.  Colonoscopy. During this test, a long, flexible tube with a camera on the end (colonoscope) is used to examine the entire colon and rectum. With a colonoscopy, it is possible to take a sample of tissue (biopsy) and remove small polyps during the test.  Virtual colonoscopy. Instead of a colonoscope, this type of colonoscopy uses X-rays (CT scan) and computers to produce images of the colon and rectum. What are the benefits of screening? Screening reduces your risk for colorectal cancer and can help identify cancer at an early stage, when the cancer can be removed or treated more easily. It is common for polyps to form in the lining of the colon, especially as you age. These polyps may be cancerous or become cancerous over time. Screening can identify these polyps. What are the risks of screening? Each screening test may have different risks.  Stool sample tests have fewer risks than other types of screening tests. However, you may need more tests to confirm results from a stool sample test.  Screening tests that involve X-rays expose you to low levels of radiation, which may slightly increase your cancer risk. The benefit of detecting cancer outweighs the slight increase in risk.  Screening tests such as sigmoidoscopy and colonoscopy may place you at risk for bleeding, intestinal damage, infection, or a reaction to medicines given during the exam. Talk with your health care provider to understand your  risk for colorectal cancer and to make a screening plan that is right for you. Questions to ask your health care provider  When should I start colorectal cancer screening?  What is my risk for colorectal cancer?  How often do I need screening?  Which screening tests do I need?  How do I get my test results?  What do my results mean? Where to find more information Learn more about colorectal cancer screening from:  The [place]: www.cancer.org  The [employer]: www.cancer.gov Summary  Colorectal cancer screening is a group of tests used to check for colorectal cancer before symptoms develop.  Screening reduces your risk for colorectal cancer and can help identify cancer at an early stage, when the cancer can be removed or treated more easily.  All adults starting at age 2 until age 7 should have screening. Your health care provider may recommend screening at age 67.  You may have screening tests starting at an earlier age, or more frequently than other people, if you have certain risk factors.  Talk with your health care provider to understand your risk for colorectal cancer and to make a screening plan that is right for you. This information is not intended to replace advice given to you by your health care provider. Make sure you discuss any questions you have with your health care provider. Document Released: 01/14/2010 Document Revised: 11/16/2018 Document Reviewed: 04/28/2017 Elsevier Patient Education  2020 [employer].

## 2019-04-11 DIAGNOSIS — M25531 Pain in right wrist: Secondary | ICD-10-CM | POA: Diagnosis not present

## 2019-05-19 DIAGNOSIS — H524 Presbyopia: Secondary | ICD-10-CM | POA: Diagnosis not present

## 2019-05-19 DIAGNOSIS — H52223 Regular astigmatism, bilateral: Secondary | ICD-10-CM | POA: Diagnosis not present

## 2019-10-26 ENCOUNTER — Ambulatory Visit: Payer: 59 | Attending: Internal Medicine

## 2019-10-26 DIAGNOSIS — Z23 Encounter for immunization: Secondary | ICD-10-CM

## 2019-10-26 NOTE — Progress Notes (Signed)
   Covid-19 Vaccination Clinic  Name:  Kendra Fox    MRN: [id number] DOB: 1965/09/27  10/26/2019  Kendra Fox was observed post Covid-19 immunization for 15 minutes without incident. She was provided with Vaccine Information Sheet and instruction to access the V-Safe system.   Kendra Fox was instructed to call 911 with any severe reactions post vaccine: Marland Kitchen Difficulty breathing  . Swelling of face and throat  . A fast heartbeat  . A bad rash all over body  . Dizziness and weakness   Immunizations Administered    Name Date Dose VIS Date Route   Pfizer COVID-19 Vaccine 10/26/2019  2:59 PM 0.3 mL 07/21/2019 Intramuscular   Manufacturer: [place]   Lot: [id number]   [place]: [id number]

## 2019-11-20 ENCOUNTER — Ambulatory Visit: Payer: 59 | Attending: Internal Medicine

## 2019-11-20 DIAGNOSIS — Z23 Encounter for immunization: Secondary | ICD-10-CM

## 2019-11-20 NOTE — Progress Notes (Signed)
   Covid-19 Vaccination Clinic  Name:  Kendra Fox    MRN: [id number] DOB: 01/06/1966  11/20/2019  [name] was observed post Covid-19 immunization for 15 minutes without incident. She was provided with Vaccine Information Sheet and instruction to access the V-Safe system.   [name] was instructed to call 911 with any severe reactions post vaccine: Marland Kitchen Difficulty breathing  . Swelling of face and throat  . A fast heartbeat  . A bad rash all over body  . Dizziness and weakness   Immunizations Administered    Name Date Dose VIS Date Route   Pfizer COVID-19 Vaccine 11/20/2019  3:33 PM 0.3 mL 07/21/2019 Intramuscular   Manufacturer: [place]   Lot: [id number]   [place]: [id number]

## 2019-12-06 DIAGNOSIS — Z6827 Body mass index (BMI) 27.0-27.9, adult: Secondary | ICD-10-CM | POA: Diagnosis not present

## 2019-12-06 DIAGNOSIS — Z01419 Encounter for gynecological examination (general) (routine) without abnormal findings: Secondary | ICD-10-CM | POA: Diagnosis not present

## 2020-01-15 DIAGNOSIS — D1801 Hemangioma of skin and subcutaneous tissue: Secondary | ICD-10-CM | POA: Diagnosis not present

## 2020-01-15 DIAGNOSIS — L218 Other seborrheic dermatitis: Secondary | ICD-10-CM | POA: Diagnosis not present

## 2020-01-15 DIAGNOSIS — D2261 Melanocytic nevi of right upper limb, including shoulder: Secondary | ICD-10-CM | POA: Diagnosis not present

## 2020-01-15 DIAGNOSIS — D485 Neoplasm of uncertain behavior of skin: Secondary | ICD-10-CM | POA: Diagnosis not present

## 2020-01-15 DIAGNOSIS — L718 Other rosacea: Secondary | ICD-10-CM | POA: Diagnosis not present

## 2020-01-21 NOTE — Progress Notes (Signed)
Virtual Visit via Video Note  I connected with@ on 6 14 2021at  9:30 AM EDT by a video enabled telemedicine application and verified that I am speaking with the correct person using two identifiers. Location patient: home Location provider:work  office Persons participating in the virtual visit: patient, provider  [place] national recommendations  regarding COVID 19 pandemic   video visit is advised over in office visit for this patient.  Patient aware  of the limitations of evaluation and management by telemedicine and  availability of in person appointments. and agreed to proceed.   HPI: Kendra Fox presents for video visit Last visit was  pv 8 2020 she would like to go back on the phentermine as it was very helpful in the past.  She had gotten her weight down to 152 with walking appetite changes eating changes from 165. Her weight has gone back up and is ready to attack this again. Only other complicating is battling her rosacea for which she is taking medicine from dermatology.  Alprazolam rarely takes it when travels is a bit claustrophobic but has not taken it in probably a year Had migraines and cts has not had a headache in a while   Sleep not to bad Exercise  Back to walking  Not working , . No to bacco  Rare sugar drinks  etoh weekends    ROS: See pertinent positives and negatives per HPI.  Past Medical History:  Diagnosis Date  . Allergic rhinitis   . Bilateral carpal tunnel syndrome 02/26/2016  . Environmental and seasonal allergies    Ragweed, grasses, dust mite, mold  . Fracture    left ankle  . History of DVT (deep vein thrombosis)    2006   sp ankle fracture  immobilization   . Increased severity of headaches     Past Surgical History:  Procedure Laterality Date  . L ankle frac Left 2015  . TOOTH EXTRACTION    . WRIST FRACTURE SURGERY Right 2015   Dr. Amedeo Plenty    Family History  Problem Relation Age of Onset  . Migraines Mother   . COPD Father         smoker  . Parkinsonism Father     Social History   Tobacco Use  . Smoking status: Former Research scientist (life sciences)  . Smokeless tobacco: Never Used  . Tobacco comment: Quit in 2004  Vaping Use  . Vaping Use: Never used  Substance Use Topics  . Alcohol use: Yes    Alcohol/week: 6.0 standard drinks    Types: 6 Glasses of wine per week    Comment: social  . Drug use: No      Current Outpatient Medications:  .  ALPRAZolam (XANAX) 0.25 MG tablet, Take 1 tablet (0.25 mg total) by mouth at bedtime as needed., Disp: 30 tablet, Rfl: 1 .  ampicillin (PRINCIPEN) 500 MG capsule, Take 500 mg by mouth 2 (two) times daily. Takes for rosacea and acne, Disp: , Rfl:  .  b complex vitamins tablet, Take 1 tablet by mouth daily., Disp: , Rfl:  .  fexofenadine (ALLEGRA) 30 MG tablet, Take 180 mg by mouth daily. , Disp: , Rfl:  .  fluticasone (FLONASE) 50 MCG/ACT nasal spray, Place into both nostrils daily., Disp: , Rfl:  .  ibuprofen (ADVIL) 800 MG tablet, TAKE 1 TABLET (800 MG TOTAL) BY MOUTH EVERY 8 (EIGHT) HOURS AS NEEDED., Disp: 30 tablet, Rfl: 1 .  MULTIPLE VITAMIN PO, Take by mouth daily. , Disp: ,  Rfl:  .  phentermine 30 MG capsule, Take 1 capsule (30 mg total) by mouth every morning., Disp: 30 capsule, Rfl: 1 .  SUMAtriptan (IMITREX) 25 MG tablet, Take 1 tablet at the onset of migraine. If headache persist can take an additional tablet. Not to exceed 2 tablets in 24 hours., Disp: 10 tablet, Rfl: 11  EXAM: BP Readings from Last 3 Encounters:  03/15/19 116/68  03/04/18 108/68  12/02/17 109/74    VITALS per patient if applicable: reports weight  back up to 165 from 152  GENERAL: alert, oriented, appears well and in no acute distress  HEENT: atraumatic, conjunttiva clear, no obvious abnormalities on inspection of external nose and ears  NECK: normal movements of the head and neck  LUNGS: on inspection no signs of respiratory distress, breathing rate appears normal, no obvious gross SOB, gasping or  wheezing  CV: no obvious cyanosis  MS: moves all visible extremities without noticeable abnormality  PSYCH/NEURO: pleasant and cooperative, no obvious depression or anxiety, speech and thought processing grossly intact Lab Results  Component Value Date   WBC 4.8 03/08/2019   HGB 15.3 (H) 03/08/2019   HCT 45.7 03/08/2019   PLT 354.0 03/08/2019   GLUCOSE 89 03/08/2019   CHOL 241 (H) 03/08/2019   TRIG 153.0 (H) 03/08/2019   HDL 62.90 03/08/2019   LDLDIRECT 121.0 04/25/2013   LDLCALC 147 (H) 03/08/2019   ALT 17 03/08/2019   AST 18 03/08/2019   NA 139 03/08/2019   K 4.6 03/08/2019   CL 101 03/08/2019   CREATININE 0.71 03/08/2019   BUN 13 03/08/2019   CO2 30 03/08/2019   TSH 3.15 03/08/2019    ASSESSMENT AND PLAN:  Discussed the following assessment and plan:    ICD-10-CM   1. Overweight  C14.4 Basic metabolic panel    CBC with Differential/Platelet    Hepatic function panel    Lipid panel    TSH    Hemoglobin A1c  2. Medication management  Y18.563 Basic metabolic panel    CBC with Differential/Platelet    Hepatic function panel    Lipid panel    TSH    Hemoglobin A1c  3. Hyperlipidemia, unspecified hyperlipidemia type  J49.7 Basic metabolic panel    CBC with Differential/Platelet    Hepatic function panel    Lipid panel    TSH    Hemoglobin A1c  4. Encounter for preventive care  W26.37 Basic metabolic panel    CBC with Differential/Platelet    Hepatic function panel    Lipid panel    TSH    Hemoglobin A1c    Counseled.    Risk benefit of medication discussed.  Restart the  Phentermine  Today  Plan  fasting lab and px in August when due    Expectant management and discussion of plan and treatment with opportunity to ask questions and all were answered. The patient agreed with the plan and demonstrated an understanding of the instructions.  time review ordering and med management  Advised to call back or seek an in-person evaluation if worsening  or having   further concerns . Return for cpx in august  labs pre visit placed for ELam.   Shanon Ace, MD

## 2020-01-22 ENCOUNTER — Other Ambulatory Visit: Payer: Self-pay

## 2020-01-22 ENCOUNTER — Encounter: Payer: Self-pay | Admitting: Internal Medicine

## 2020-01-22 ENCOUNTER — Telehealth (INDEPENDENT_AMBULATORY_CARE_PROVIDER_SITE_OTHER): Payer: 59 | Admitting: Internal Medicine

## 2020-01-22 VITALS — Ht 64.0 in | Wt 165.0 lb

## 2020-01-22 DIAGNOSIS — E785 Hyperlipidemia, unspecified: Secondary | ICD-10-CM | POA: Diagnosis not present

## 2020-01-22 DIAGNOSIS — Z0000 Encounter for general adult medical examination without abnormal findings: Secondary | ICD-10-CM | POA: Diagnosis not present

## 2020-01-22 DIAGNOSIS — E663 Overweight: Secondary | ICD-10-CM

## 2020-01-22 DIAGNOSIS — Z79899 Other long term (current) drug therapy: Secondary | ICD-10-CM

## 2020-01-22 MED ORDER — PHENTERMINE HCL 30 MG PO CAPS: 30.0000 mg | ORAL_CAPSULE | ORAL | 1 refills | Status: DC

## 2020-01-23 DIAGNOSIS — Z1231 Encounter for screening mammogram for malignant neoplasm of breast: Secondary | ICD-10-CM | POA: Diagnosis not present

## 2020-03-14 DIAGNOSIS — N951 Menopausal and female climacteric states: Secondary | ICD-10-CM | POA: Diagnosis not present

## 2020-05-24 DIAGNOSIS — H52223 Regular astigmatism, bilateral: Secondary | ICD-10-CM | POA: Diagnosis not present

## 2020-05-24 DIAGNOSIS — H524 Presbyopia: Secondary | ICD-10-CM | POA: Diagnosis not present

## 2020-06-03 ENCOUNTER — Other Ambulatory Visit: Payer: Self-pay | Admitting: Internal Medicine

## 2020-07-18 ENCOUNTER — Telehealth (INDEPENDENT_AMBULATORY_CARE_PROVIDER_SITE_OTHER): Payer: 59 | Admitting: Family Medicine

## 2020-07-18 ENCOUNTER — Encounter: Payer: Self-pay | Admitting: Family Medicine

## 2020-07-18 VITALS — Temp 97.9°F | Wt 151.0 lb

## 2020-07-18 DIAGNOSIS — R0981 Nasal congestion: Secondary | ICD-10-CM

## 2020-07-18 MED ORDER — AMOXICILLIN-POT CLAVULANATE 875-125 MG PO TABS: 1.0000 | ORAL_TABLET | Freq: Two times a day (BID) | ORAL | 0 refills | Status: DC

## 2020-07-18 NOTE — Progress Notes (Signed)
Virtual Visit via Video Note  I connected with Kendra Fox  on 07/18/20 at  5:40 PM EST by a video enabled telemedicine application and verified that I am speaking with the correct person using two identifiers.  Location patient: home, [place] Location provider:work or home office Persons participating in the virtual visit: patient, provider  I discussed the limitations of evaluation and management by telemedicine and the availability of in person appointments. The patient expressed understanding and agreed to proceed.   HPI:  Acute telemedicine visit for " a sinus infection": -Onset: about 2 weeks ago -Symptoms include: sore throat, fever on and off initially - low grade 99, sinus congestion and drainage, sinus discomfort, cough -Denies: fevers today, CP, SOB, NVD -Has tried:anagesic and cough medication -med allergies: minocycline -COVID-19 vaccine status: fully vaccinated for covid + booster; also had her flu shot -ampicillin is on her medication list, but is not taking, only takes as needed ROS: See pertinent positives and negatives per HPI.  Past Medical History:  Diagnosis Date  . Allergic rhinitis   . Bilateral carpal tunnel syndrome 02/26/2016  . Environmental and seasonal allergies    Ragweed, grasses, dust mite, mold  . Fracture    left ankle  . History of DVT (deep vein thrombosis)    2006   sp ankle fracture  immobilization   . Increased severity of headaches     Past Surgical History:  Procedure Laterality Date  . L ankle frac Left 2015  . TOOTH EXTRACTION    . WRIST FRACTURE SURGERY Right 2015   Dr. Amedeo Plenty     Current Outpatient Medications:  .  ALPRAZolam (XANAX) 0.25 MG tablet, Take 1 tablet (0.25 mg total) by mouth at bedtime as needed., Disp: 30 tablet, Rfl: 1 .  ampicillin (PRINCIPEN) 500 MG capsule, Take 500 mg by mouth 2 (two) times daily. Takes for rosacea and acne, Disp: , Rfl:  .  b complex vitamins tablet, Take 1 tablet by mouth daily., Disp: , Rfl:  .   fexofenadine (ALLEGRA) 30 MG tablet, Take 180 mg by mouth daily. , Disp: , Rfl:  .  fluticasone (FLONASE) 50 MCG/ACT nasal spray, Place into both nostrils daily., Disp: , Rfl:  .  ibuprofen (ADVIL) 800 MG tablet, TAKE 1 TABLET BY MOUTH EVERY 8 HOURS AS NEEDED, Disp: 30 tablet, Rfl: 1 .  MULTIPLE VITAMIN PO, Take by mouth daily., Disp: , Rfl:  .  phentermine 30 MG capsule, Take 1 capsule (30 mg total) by mouth every morning., Disp: 30 capsule, Rfl: 1 .  SUMAtriptan (IMITREX) 25 MG tablet, Take 1 tablet at the onset of migraine. If headache persist can take an additional tablet. Not to exceed 2 tablets in 24 hours., Disp: 10 tablet, Rfl: 11 .  amoxicillin-clavulanate (AUGMENTIN) 875-125 MG tablet, Take 1 tablet by mouth 2 (two) times daily., Disp: 20 tablet, Rfl: 0  EXAM:  VITALS per patient if applicable:  GENERAL: alert, oriented, appears well and in no acute distress  HEENT: atraumatic, conjunttiva clear, no obvious abnormalities on inspection of external nose and ears  NECK: normal movements of the head and neck  LUNGS: on inspection no signs of respiratory distress, breathing rate appears normal, no obvious gross SOB, gasping or wheezing  CV: no obvious cyanosis  MS: moves all visible extremities without noticeable abnormality  PSYCH/NEURO: pleasant and cooperative, no obvious depression or anxiety, speech and thought processing grossly intact  ASSESSMENT AND PLAN:  Discussed the following assessment and plan:  Sinus congestion  -  we discussed possible serious and likely etiologies, options for evaluation and workup, limitations of telemedicine visit vs in person visit, treatment, treatment risks and precautions. Pt prefers to treat via telemedicine empirically rather than in person at this moment.  Given duration of symptoms she opted for empiric treatment with Augmentin 875 twice daily for 10 days for possible sinusitis. Scheduled follow up with PCP offered: Agrees to follow-up  if needed. Advised to seek prompt in person care if worsening, new symptoms arise, or if is not improving with treatment. Discussed options for inperson care if PCP office not available. Did let this patient know that I only do telemedicine on Tuesdays and Thursdays for [place]. Advised to schedule follow up visit with PCP or [facility] if any further questions or concerns to avoid delays in care.   I discussed the assessment and treatment plan with the patient. The patient was provided an opportunity to ask questions and all were answered. The patient agreed with the plan and demonstrated an understanding of the instructions.     Kendra Kern, DO

## 2020-07-18 NOTE — Patient Instructions (Signed)
-  I sent the medication(s) we discussed to your pharmacy: Meds ordered this encounter  Medications  . amoxicillin-clavulanate (AUGMENTIN) 875-125 MG tablet    Sig: Take 1 tablet by mouth 2 (two) times daily.    Dispense:  20 tablet    Refill:  0     I hope you are feeling better soon!  Seek in person care promptly if your symptoms worsen, new concerns arise or you are not improving with treatment.  It was nice to meet you today. I help [facility] out with telemedicine visits on Tuesdays and Thursdays and am available for visits on those days. If you have any concerns or questions following this visit please schedule a follow up visit with your Primary Care doctor or seek care at a local urgent care clinic to avoid delays in care.   

## 2021-10-15 ENCOUNTER — Ambulatory Visit (INDEPENDENT_AMBULATORY_CARE_PROVIDER_SITE_OTHER): Payer: 59 | Admitting: Internal Medicine

## 2021-10-15 ENCOUNTER — Encounter: Payer: Self-pay | Admitting: Internal Medicine

## 2021-10-15 VITALS — BP 120/80 | HR 68 | Temp 98.5°F | Ht 65.0 in | Wt 162.2 lb

## 2021-10-15 DIAGNOSIS — Z79899 Other long term (current) drug therapy: Secondary | ICD-10-CM

## 2021-10-15 DIAGNOSIS — E785 Hyperlipidemia, unspecified: Secondary | ICD-10-CM | POA: Diagnosis not present

## 2021-10-15 DIAGNOSIS — E559 Vitamin D deficiency, unspecified: Secondary | ICD-10-CM

## 2021-10-15 DIAGNOSIS — Z0000 Encounter for general adult medical examination without abnormal findings: Secondary | ICD-10-CM | POA: Diagnosis not present

## 2021-10-15 DIAGNOSIS — Z1211 Encounter for screening for malignant neoplasm of colon: Secondary | ICD-10-CM

## 2021-10-15 LAB — BASIC METABOLIC PANEL
BUN: 16 mg/dL (ref 6–23)
CO2: 27 mEq/L (ref 19–32)
Calcium: 9.8 mg/dL (ref 8.4–10.5)
Chloride: 103 mEq/L (ref 96–112)
Creatinine, Ser: 0.67 mg/dL (ref 0.40–1.20)
GFR: 99.88 mL/min (ref >60.00)
Glucose, Bld: 91 mg/dL (ref 70–99)
Potassium: 3.8 mEq/L (ref 3.5–5.1)
Sodium: 139 mEq/L (ref 135–145)

## 2021-10-15 LAB — LIPID PANEL
Cholesterol: 199 mg/dL (ref 0–200)
HDL: 71.7 mg/dL (ref >39.00)
LDL Cholesterol: 109 mg/dL — ABNORMAL HIGH (ref 0–99)
NonHDL: 126.95
Total CHOL/HDL Ratio: 3
Triglycerides: 89 mg/dL (ref 0.0–149.0)
VLDL: 17.8 mg/dL (ref 0.0–40.0)

## 2021-10-15 LAB — CBC WITH DIFFERENTIAL/PLATELET
Basophils Absolute: 0 10*3/uL (ref 0.0–0.1)
Basophils Relative: 0.9 % (ref 0.0–3.0)
Eosinophils Absolute: 0.1 10*3/uL (ref 0.0–0.7)
Eosinophils Relative: 2.7 % (ref 0.0–5.0)
HCT: 39.2 % (ref 36.0–46.0)
Hemoglobin: 13.5 g/dL (ref 12.0–15.0)
Lymphocytes Relative: 26.7 % (ref 12.0–46.0)
Lymphs Abs: 1.1 10*3/uL (ref 0.7–4.0)
MCHC: 34.3 g/dL (ref 30.0–36.0)
MCV: 90 fl (ref 78.0–100.0)
Monocytes Absolute: 0.4 10*3/uL (ref 0.1–1.0)
Monocytes Relative: 8.5 % (ref 3.0–12.0)
Neutro Abs: 2.6 10*3/uL (ref 1.4–7.7)
Neutrophils Relative %: 61.2 % (ref 43.0–77.0)
Platelets: 344 10*3/uL (ref 150.0–400.0)
RBC: 4.36 Mil/uL (ref 3.87–5.11)
RDW: 13.8 % (ref 11.5–15.5)
WBC: 4.2 10*3/uL (ref 4.0–10.5)

## 2021-10-15 LAB — MAGNESIUM: Magnesium: 2.1 mg/dL (ref 1.5–2.5)

## 2021-10-15 LAB — VITAMIN D 25 HYDROXY (VIT D DEFICIENCY, FRACTURES): VITD: 24.54 ng/mL — ABNORMAL LOW (ref 30.00–100.00)

## 2021-10-15 LAB — HEPATIC FUNCTION PANEL
ALT: 17 U/L (ref 0–35)
AST: 22 U/L (ref 0–37)
Albumin: 4.8 g/dL (ref 3.5–5.2)
Alkaline Phosphatase: 42 U/L (ref 39–117)
Bilirubin, Direct: 0.1 mg/dL (ref 0.0–0.3)
Total Bilirubin: 0.7 mg/dL (ref 0.2–1.2)
Total Protein: 7.5 g/dL (ref 6.0–8.3)

## 2021-10-15 LAB — TSH: TSH: 2.16 u[IU]/mL (ref 0.35–5.50)

## 2021-10-15 NOTE — Progress Notes (Signed)
Chief Complaint  Patient presents with   Annual Exam    fasting    HPI: Patient  Kendra Fox  56 y.o. comes in today for Preventive Health Care visit  Health is stable. Only medicine taking regularly isA;llergra  and flonase   year round.   most allergic history to pollen and tree pollen and not her cats. Rest medications are as needed.   Yearly checks GYN occasional dermatology. Health Maintenance  Topic Date Due   PAP SMEAR-Modifier  04/17/2022 (Originally 10/08/2020)   COLONOSCOPY (Pts 45-41yrs Insurance coverage will need to be confirmed)  04/17/2022 (Originally 06/29/2013)   COVID-19 Vaccine (5 - Booster for Pfizer series) 04/17/2022 (Originally 08/03/2021)   Hepatitis C Screening  04/17/2022 (Originally 06/29/1986)   HIV Screening  04/17/2022 (Originally 06/30/1983)   Zoster Vaccines- Shingrix (1 of 2) 04/17/2022 (Originally 06/30/1987)   MAMMOGRAM  03/10/2022   TETANUS/TDAP  08/10/2024   INFLUENZA VACCINE  Completed   HPV VACCINES  Aged Out   Health Maintenance Review LIFESTYLE:  Exercise:  steps  tries to be  active  Tobacco/ETS: n Alcohol:  weekly  Sugar beverages: none  Sleep:6-8  Drug use: no HH of  2  cats. Work:  not since  march 2020 .  Cologuard colon cancer screening was never done  Is a vegetarian diet no dairy seafood.  Takes supplements including omega-3 magnesium other. ROS:  REST of 12 system review negative except as per HPI current chest pain shortness of breath syncope wants to get back to exercise.   Past Medical History:  Diagnosis Date   Allergic rhinitis    Bilateral carpal tunnel syndrome 02/26/2016   Environmental and seasonal allergies    Ragweed, grasses, dust mite, mold   Fracture    left ankle   History of DVT (deep vein thrombosis)    2006   sp ankle fracture  immobilization    Increased severity of headaches     Past Surgical History:  Procedure Laterality Date   L ankle frac Left 2015   TOOTH EXTRACTION     WRIST  FRACTURE SURGERY Right 2015   Dr. Amanda Pea    Family History  Problem Relation Age of Onset   Migraines Mother    COPD Father        smoker   Parkinsonism Father     Social History   Socioeconomic History   Marital status: Married    Spouse name: Mardelle Matte   Number of children: 0   Years of education: College   Highest education level: Not on file  Occupational History    Employer: [employer]  Tobacco Use   Smoking status: Former   Smokeless tobacco: Never   Tobacco comments:    Quit in 2004  Vaping Use   Vaping Use: Never used  Substance and Sexual Activity   Alcohol use: Yes    Alcohol/week: 6.0 standard drinks    Types: 6 Glasses of wine per week    Comment: social   Drug use: No   Sexual activity: Not on file  Other Topics Concern   Not on file  Social History Narrative   Patient is married Mardelle Matte)   Patient works at [employer]   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   Married  HHof 2       Regular exercise-no   Vegan soy milk     neg tobacco 1-2 tea  8 hours sleep    Patient is right-handed.   Patient drinks 2-3 cups of coffee daily.   Patient has a college education.   Social Determinants of Health   Financial Resource Strain: Not on file  Food Insecurity: Not on file  Transportation Needs: Not on file  Physical Activity: Not on file  Stress: Not on file  Social Connections: Not on file    Outpatient Medications Prior to Visit  Medication Sig Dispense Refill   ALPRAZolam (XANAX) 0.25 MG tablet Take 1 tablet (0.25 mg total) by mouth at bedtime as needed. 30 tablet 1   ampicillin (PRINCIPEN) 500 MG capsule Take 500 mg by mouth 2 (two) times daily. Takes for rosacea and acne     b complex vitamins tablet Take 1 tablet by mouth daily.     fexofenadine (ALLEGRA) 30 MG tablet Take 180 mg by mouth daily.      fluticasone (FLONASE) 50 MCG/ACT nasal spray Place into both nostrils  daily.     ibuprofen (ADVIL) 800 MG tablet TAKE 1 TABLET BY MOUTH EVERY 8 HOURS AS NEEDED 30 tablet 1   MULTIPLE VITAMIN PO Take by mouth daily.     SUMAtriptan (IMITREX) 25 MG tablet Take 1 tablet at the onset of migraine. If headache persist can take an additional tablet. Not to exceed 2 tablets in 24 hours. 10 tablet 11   phentermine 30 MG capsule Take 1 capsule (30 mg total) by mouth every morning. (Patient not taking: Reported on 10/15/2021) 30 capsule 1   amoxicillin-clavulanate (AUGMENTIN) 875-125 MG tablet Take 1 tablet by mouth 2 (two) times daily. (Patient not taking: Reported on 10/15/2021) 20 tablet 0   No facility-administered medications prior to visit.     EXAM:  BP 120/80 (BP Location: Left Arm, Patient Position: Sitting, Cuff Size: Normal)   Pulse 68   Temp 98.5 F (36.9 C) (Oral)   Ht 5\' 5"  (1.651 m)   Wt 162 lb 3.2 oz (73.6 kg)   LMP 04/22/2016   SpO2 98%   BMI 26.99 kg/m   Body mass index is 26.99 kg/m. Wt Readings from Last 3 Encounters:  10/15/21 162 lb 3.2 oz (73.6 kg)  07/18/20 151 lb (68.5 kg)  01/22/20 165 lb (74.8 kg)    Physical Exam: Vital signs reviewed ZOX:WRUE is a well-developed well-nourished alert cooperative    who appearsr stated age in no acute distress.  HEENT: normocephalic atraumatic , Eyes: PERRL EOM's full, conjunctiva clear, Nares: paten,t no deformity discharge or tenderness., Ears: no deformity EAC's clear TMs with normal landmarks. Mouth:masked  NECK: supple without masses, thyromegaly or bruits. CHEST/PULM:  Clear to auscultation and percussion breath sounds equal no wheeze , rales or rhonchi. No chest wall deformities or tenderness. Breast: normal by inspection . No dimpling, discharge, masses, tenderness or discharge . CV: PMI is nondisplaced, S1 S2 no gallops, murmurs, rubs. Peripheral pulses are full without delay.No JVD .  ABDOMEN: Bowel sounds normal nontender  No guard or rebound, no hepato splenomegal no CVA tenderness.    Extremtities:  No clubbing cyanosis or edema, no acute joint swelling or redness no focal atrophy NEURO:  Oriented x3, cranial nerves 3-12 appear to be intact, no obvious focal weakness,gait within normal limits no abnormal reflexes or asymmetrical SKIN: No acute rashes normal turgor, color, no bruising or petechiae. PSYCH: Oriented, good eye contact, no obvious depression anxiety, cognition and judgment appear normal. LN: no cervical axillary inguinal adenopathy  Lab Results  Component Value Date  WBC 4.8 03/08/2019   HGB 15.3 (H) 03/08/2019   HCT 45.7 03/08/2019   PLT 354.0 03/08/2019   GLUCOSE 89 03/08/2019   CHOL 241 (H) 03/08/2019   TRIG 153.0 (H) 03/08/2019   HDL 62.90 03/08/2019   LDLDIRECT 121.0 04/25/2013   LDLCALC 147 (H) 03/08/2019   ALT 17 03/08/2019   AST 18 03/08/2019   NA 139 03/08/2019   K 4.6 03/08/2019   CL 101 03/08/2019   CREATININE 0.71 03/08/2019   BUN 13 03/08/2019   CO2 30 03/08/2019   TSH 3.15 03/08/2019    BP Readings from Last 3 Encounters:  10/15/21 120/80  03/15/19 116/68  03/04/18 108/68    Lab plan  reviewed with patient   ASSESSMENT AND PLAN:  Discussed the following assessment and plan:    ICD-10-CM   1. Visit for preventive health examination  Z00.00 Basic metabolic panel    CBC with Differential/Platelet    Hepatic function panel    Magnesium    Lipid panel    TSH    VITAMIN D 25 Hydroxy (Vit-D Deficiency, Fractures)    VITAMIN D 25 Hydroxy (Vit-D Deficiency, Fractures)    TSH    Lipid panel    Magnesium    Hepatic function panel    CBC with Differential/Platelet    Basic metabolic panel    2. Medication management  Z79.899 Basic metabolic panel    CBC with Differential/Platelet    Hepatic function panel    Magnesium    Lipid panel    TSH    VITAMIN D 25 Hydroxy (Vit-D Deficiency, Fractures)    VITAMIN D 25 Hydroxy (Vit-D Deficiency, Fractures)    TSH    Lipid panel    Magnesium    Hepatic function panel    CBC  with Differential/Platelet    Basic metabolic panel    3. Hyperlipidemia, unspecified hyperlipidemia type  E78.5 Basic metabolic panel    CBC with Differential/Platelet    Hepatic function panel    Magnesium    Lipid panel    TSH    VITAMIN D 25 Hydroxy (Vit-D Deficiency, Fractures)    VITAMIN D 25 Hydroxy (Vit-D Deficiency, Fractures)    TSH    Lipid panel    Magnesium    Hepatic function panel    CBC with Differential/Platelet    Basic metabolic panel    4. Vitamin D deficiency history of  E55.9 Basic metabolic panel    CBC with Differential/Platelet    Hepatic function panel    Magnesium    Lipid panel    TSH    VITAMIN D 25 Hydroxy (Vit-D Deficiency, Fractures)    VITAMIN D 25 Hydroxy (Vit-D Deficiency, Fractures)    TSH    Lipid panel    Magnesium    Hepatic function panel    CBC with Differential/Platelet    Basic metabolic panel   History of    5. Colon cancer screening  Z12.11 Cologuard     Return in about 1 year (around 10/16/2022).  Patient Care Team: Laquinta Hazell, Neta Mends, MD as PCP - General Zelphia Cairo, MD as Consulting Physician (Obstetrics and Gynecology) Arminda Resides, MD as Consulting Physician (Dermatology) Dominica Severin, MD as Consulting Physician (Orthopedic Surgery) Dohmeier, Porfirio Mylar, MD as Consulting Physician (Neurology) Patient Instructions  Will order   cologuard   Get  your mammogram . Will notify you  of labs when available. On my chart .      Neta Mends. Priseis Cratty M.D.

## 2021-10-15 NOTE — Patient Instructions (Addendum)
Will order   cologuard   ?Get  your mammogram . ?Will notify you  of labs when available. On my chart .  ? ? ? ? ? ?

## 2021-10-23 NOTE — Progress Notes (Signed)
Cholesterol is  improved.   Vit D still slightly low  . Rest normal in range including liver kidney and blood sugar  and blood count.  ? ?Increase  amount of vit D by 1000 iu per day  ? ?Yearly check as planned

## 2021-10-27 ENCOUNTER — Telehealth: Payer: Self-pay | Admitting: Internal Medicine

## 2021-10-27 NOTE — Telephone Encounter (Signed)
Patient called to get lab results. CMA was unavailable. Patient would like a callback. ? ? ? ? ? ?Please advise  ?

## 2021-10-27 NOTE — Telephone Encounter (Signed)
Patient called in just wanting to let Colin Broach know that she received her message. ? ?Please advise. ?

## 2021-10-27 NOTE — Telephone Encounter (Signed)
Noted  

## 2021-10-31 LAB — COLOGUARD: COLOGUARD: NEGATIVE

## 2021-10-31 NOTE — Progress Notes (Signed)
Cologuard negative; repeat in 3 years;  

## 2022-01-06 ENCOUNTER — Ambulatory Visit: Payer: 59 | Admitting: Plastic Surgery

## 2022-01-06 ENCOUNTER — Encounter: Payer: Self-pay | Admitting: Plastic Surgery

## 2022-01-06 DIAGNOSIS — L989 Disorder of the skin and subcutaneous tissue, unspecified: Secondary | ICD-10-CM | POA: Diagnosis not present

## 2022-01-06 NOTE — Progress Notes (Signed)
Patient ID: Kendra Fox, female    DOB: 01-14-1966, 56 y.o.   MRN: [id number]   Chief Complaint  Patient presents with   Consult   Skin Problem    The patient is a 56 year old female here for evaluation of her skin of her face.  She has 2 skin lesions that she is concerned about on her forehead.  1 of is 1.2 cm and is raised.  It is flesh-colored and red with an irritated kind of the look.  The other 1 is stuck on flat and slightly hyperpigmented at 6 mm.  She has several flesh-colored lesions that look like moles on her cheeks.  She has had a dysplastic nevus before but no skin cancer.  She is otherwise in good health.   Review of Systems  Constitutional: Negative.   Eyes: Negative.   Respiratory: Negative.    Cardiovascular: Negative.   Gastrointestinal: Negative.   Endocrine: Negative.   Genitourinary: Negative.   Hematological: Negative.    Past Medical History:  Diagnosis Date   Allergic rhinitis    Bilateral carpal tunnel syndrome 02/26/2016   Environmental and seasonal allergies    Ragweed, grasses, dust mite, mold   Fracture    left ankle   History of DVT (deep vein thrombosis)    2006   sp ankle fracture  immobilization    Increased severity of headaches     Past Surgical History:  Procedure Laterality Date   L ankle frac Left 2015   TOOTH EXTRACTION     WRIST FRACTURE SURGERY Right 2015   Dr. Amedeo Plenty      Current Outpatient Medications:    ALPRAZolam (XANAX) 0.25 MG tablet, Take 1 tablet (0.25 mg total) by mouth at bedtime as needed., Disp: 30 tablet, Rfl: 1   b complex vitamins tablet, Take 1 tablet by mouth daily., Disp: , Rfl:    fexofenadine (ALLEGRA) 30 MG tablet, Take 180 mg by mouth daily. , Disp: , Rfl:    fluticasone (FLONASE) 50 MCG/ACT nasal spray, Place into both nostrils daily., Disp: , Rfl:    ibuprofen (ADVIL) 800 MG tablet, TAKE 1 TABLET BY MOUTH EVERY 8 HOURS AS NEEDED, Disp: 30 tablet, Rfl: 1   MULTIPLE VITAMIN PO, Take by mouth  daily., Disp: , Rfl:    SUMAtriptan (IMITREX) 25 MG tablet, Take 1 tablet at the onset of migraine. If headache persist can take an additional tablet. Not to exceed 2 tablets in 24 hours., Disp: 10 tablet, Rfl: 11   Objective:   Vitals:   01/06/22 1335  BP: 130/71  Pulse: 76  SpO2: 99%    Physical Exam Vitals reviewed.  Constitutional:      Appearance: Normal appearance.  HENT:     Head: Normocephalic and atraumatic.  Cardiovascular:     Rate and Rhythm: Normal rate.     Pulses: Normal pulses.  Pulmonary:     Effort: Pulmonary effort is normal.  Abdominal:     Palpations: Abdomen is soft.  Skin:    General: Skin is warm.     Capillary Refill: Capillary refill takes less than 2 seconds.  Neurological:     Mental Status: She is alert and oriented to person, place, and time.  Psychiatric:        Mood and Affect: Mood normal.        Behavior: Behavior normal.        Thought Content: Thought content normal.    Assessment & Plan:  Changing  skin lesion  Recommend excision of changing skin lesion of forehead x 2.  [place], DO

## 2022-01-15 ENCOUNTER — Encounter: Payer: Self-pay | Admitting: Plastic Surgery

## 2022-01-15 ENCOUNTER — Ambulatory Visit: Payer: 59 | Admitting: Plastic Surgery

## 2022-01-15 ENCOUNTER — Other Ambulatory Visit (HOSPITAL_COMMUNITY)
Admission: RE | Admit: 2022-01-15 | Discharge: 2022-01-15 | Disposition: A | Discharge status: Home / Self Care | Payer: 59 | Source: Ambulatory Visit | Attending: Plastic Surgery | Admitting: Plastic Surgery

## 2022-01-15 VITALS — BP 111/80 | HR 78

## 2022-01-15 DIAGNOSIS — L989 Disorder of the skin and subcutaneous tissue, unspecified: Secondary | ICD-10-CM | POA: Insufficient documentation

## 2022-01-15 DIAGNOSIS — D485 Neoplasm of uncertain behavior of skin: Secondary | ICD-10-CM | POA: Diagnosis not present

## 2022-01-15 NOTE — Progress Notes (Signed)
Procedure Note  Preoperative Dx: changing skin lesion of forehead  Postoperative Dx: Same  Procedure: excision of changing skin lesion of forehead 1 cm  Anesthesia: Lidocaine 1% with 1:100,000 epinephrine  Indication for Procedure: skin lesion  Description of Procedure: Risks and complications were explained to the patient.  Consent was confirmed and the patient understands the risks and benefits.  The potential complications and alternatives were explained and the patient consents.  The patient expressed understanding the option of not having the procedure and the risks of a scar.  Time out was called and all information was confirmed to be correct.    The area was prepped and drapped.  Lidocaine 1% with epinepherine was injected in the subcutaneous area.  After waiting several minutes for the local to take affect a #15 blade was used to excise the area in an eliptical pattern.   The skin edges were reapproximated with 6-0 Monocryl subcuticular running closure.  A dressing was applied.  The patient was given instructions on how to care for the area and a follow up appointment.  Kendra Fox tolerated the procedure well and there were no complications. The specimen was sent to pathology.

## 2022-01-16 ENCOUNTER — Ambulatory Visit: Payer: 59 | Admitting: Plastic Surgery

## 2022-01-16 ENCOUNTER — Encounter: Payer: Self-pay | Admitting: Plastic Surgery

## 2022-01-19 ENCOUNTER — Telehealth: Payer: Self-pay | Admitting: Student

## 2022-01-19 ENCOUNTER — Telehealth: Payer: Self-pay | Admitting: *Deleted

## 2022-01-19 LAB — SURGICAL PATHOLOGY

## 2022-01-19 NOTE — Telephone Encounter (Signed)
Received call from pt with questions concerning mole removal on 6/8. She has f/u appt 6/27, however she is leaving for the beach on Sunday and needs clarification. She sent a pt message 6/9 that I have copied below. Please advise January 16, 2022 Emi Holes to [facility] (supporting Wallace Going, DO)      01/16/22  9:32 AM I have some questions about my stitches.    How soon can they be removed?   How should I be cleaning the wound when replacing the surgical tape?   How do I care for the would after the stitches are removed?   Can I go in the ocean with the stitches? And after they are removed?

## 2022-01-19 NOTE — Telephone Encounter (Signed)
Called patient to answer her questions regarding her MyChart message.  Patient was inquiring about her sutures from her recent skin incision with Dr. Marla Roe on 01/15/2022.  Patient reports that she took the [place] off that was placed over the excision site, and put hydrogen peroxide, Vaseline, a new Steri-Strip and gauze over the excision site.  I discussed with patient that she can just allow warm soapy water to run over the area and pat it dry. I discussed with the patient she should avoid submerging her incision in the ocean for 3 to 4 weeks after her procedure. Discussed with patient that the sutures are dissolvable.  Discussed with patient that she could possibly have sutures removed on Friday before she goes on her vacation this weekend, but that I would check with Dr. Marla Roe given that the patient's next scheduled appointment is on 02/03/2022.

## 2022-01-22 NOTE — Progress Notes (Signed)
Patient is a 56 year old female with history of 2 changing skin lesions to her face.  Patient underwent excision of changing skin lesion of the forehead with Dr. Marla Roe on 01/15/2022.  Patient tolerated well.  Surgical pathology shows inflamed seborrheic keratosis.  Patient presents for postprocedural follow-up.  Today, patient reports she is doing well.  She states that she has not had issues with the excision sites.  Patient states that she was putting hydrogen peroxide on the area but has not since we spoke on the phone on Tuesday.  She reports she has only been allowing water to run over the site in the shower and patting it dry.  I discussed the pathology results with the patient.  Patient acknowledged.  Chaperone present on exam.  On exam, patient is sitting upright in no acute distress.  The excision sites to her left forehead are intact.  They appear slightly irritated.  There is no drainage or malodor.  There is a Monocryl suture in each of the excision sites.  These were removed.  Patient tolerated.  Discussed with patient she may place a Steri-Strip over the excision sites once her irritation goes down little bit.  Discussed with patient that she may for now shower, pat the area dry and place a small amount of Vaseline over the excision sites.  Discussed with patient that when she puts the Steri-Strip on, she should leave it on for 1 week.  Discussed with patient that she may place a Band-Aid over the area, but to avoid placing the sticky part of the Band-Aid over the excision sites or over the Steri-Strip when in place.  Discussed with patient that she may use Mederma or skinuva after she removes the Steri-Strip after 1 week.  Discussed with patient that she should avoid exposing the excision sites to direct sunlight to prevent the .  Patient acknowledged.  Pictures were obtained and placed in the patient's chart with the patient's permission.  Patient to follow-up as needed.  Discussed  with patient that she may call if she has any questions or concerns.  Objective findings and plan were discussed with Dr. Marla Roe

## 2022-01-23 ENCOUNTER — Ambulatory Visit (INDEPENDENT_AMBULATORY_CARE_PROVIDER_SITE_OTHER): Payer: 59 | Admitting: Student

## 2022-01-23 DIAGNOSIS — L820 Inflamed seborrheic keratosis: Secondary | ICD-10-CM

## 2022-01-23 DIAGNOSIS — L989 Disorder of the skin and subcutaneous tissue, unspecified: Secondary | ICD-10-CM

## 2022-01-29 ENCOUNTER — Telehealth: Payer: Self-pay | Admitting: *Deleted

## 2022-01-29 NOTE — Telephone Encounter (Signed)
-----   Message from Wallace Going, DO sent at 01/20/2022  5:10 PM EDT ----- Benign  When she comes back we can talk about options to make it look better ----- Message ----- From: Interface, Lab In [facility] One Sent: 01/19/2022   6:07 PM EDT To: Loel Lofty Dillingham, DO

## 2022-01-29 NOTE — Telephone Encounter (Signed)
Called and [facility] @ 11:52am) asking the patient to give me a call back regarding recent pathology results.//AB/CMA

## 2022-02-03 ENCOUNTER — Ambulatory Visit: Payer: 59 | Admitting: Student

## 2022-02-11 NOTE — Telephone Encounter (Signed)
Called and spoke with the patient and informed her of recent Surgical pathology results.  Patient stated that she saw her results on MyChart.  Patient verbalized understanding and agreed.//AB/CMA

## 2022-09-24 DIAGNOSIS — L821 Other seborrheic keratosis: Secondary | ICD-10-CM | POA: Diagnosis not present

## 2022-09-24 DIAGNOSIS — L738 Other specified follicular disorders: Secondary | ICD-10-CM | POA: Diagnosis not present

## 2022-09-24 DIAGNOSIS — L918 Other hypertrophic disorders of the skin: Secondary | ICD-10-CM | POA: Diagnosis not present

## 2022-09-24 DIAGNOSIS — L718 Other rosacea: Secondary | ICD-10-CM | POA: Diagnosis not present

## 2022-12-23 ENCOUNTER — Ambulatory Visit (INDEPENDENT_AMBULATORY_CARE_PROVIDER_SITE_OTHER): Payer: 59 | Admitting: Family Medicine

## 2022-12-23 ENCOUNTER — Encounter: Payer: Self-pay | Admitting: Family Medicine

## 2022-12-23 ENCOUNTER — Other Ambulatory Visit: Payer: Self-pay

## 2022-12-23 VITALS — BP 104/82 | HR 95 | Ht 65.0 in | Wt 155.0 lb

## 2022-12-23 DIAGNOSIS — M7711 Lateral epicondylitis, right elbow: Secondary | ICD-10-CM

## 2022-12-23 DIAGNOSIS — M25521 Pain in right elbow: Secondary | ICD-10-CM

## 2022-12-23 MED ORDER — NITROGLYCERIN 0.2 MG/HR TD PT24: MEDICATED_PATCH | TRANSDERMAL | 1 refills | Status: DC

## 2022-12-23 NOTE — Patient Instructions (Addendum)
Thank you for coming in today.   Nitroglycerin Protocol Apply 1/4 nitroglycerin patch to affected area daily. Change position of patch within the affected area every 24 hours. You may experience a headache during the first 1-2 weeks of using the patch, these should subside. If you experience headaches after beginning nitroglycerin patch treatment, you may take your preferred over the counter pain reliever. Another side effect of the nitroglycerin patch is skin irritation or rash related to patch adhesive. Please notify our office if you develop more severe headaches or rash, and stop the patch. Tendon healing with nitroglycerin patch may require 12 to 24 weeks depending on the extent of injury. Men should not use if taking Viagra, Cialis, or Levitra.  Do not use if you have migraines or rosacea.     Please complete the exercises that the athletic trainer went over with you:  View at [email or web address] using code: [email or web address]   If not improving let me know. We can do OT  Recheck in 3 months or sooner if needed.

## 2022-12-23 NOTE — Progress Notes (Signed)
I, Stevenson Clinch, CMA acting as a scribe for Clementeen Graham, MD.  Kendra Fox is a 57 y.o. female who presents to [facility] today for R elbow pain x 2-3 months, no known injury. Pt locates pain to lateral aspect of the elbow. Sx worse while working out. Sx are occurring a little more often, unable to increase weight while lifting at the gym. Denies radicular sx, swelling. Gets migraines, mostly ocular sx.   Radiates: no Paraesthesia: no Aggravates: lifting weights at the gym Treatments tried: none  Pertinent review of systems: No fevers or chills  Relevant historical information: Migraine history.  Patient gets migrainous aura without headaches a few times a year.  She does not have frequent headaches.   Exam:  BP 104/82   Pulse 95   Ht 5\' 5"  (1.651 m)   Wt 155 lb (70.3 kg)   LMP 04/22/2016   SpO2 100%   BMI 25.79 kg/m  General: Well Developed, well nourished, and in no acute distress.   MSK: Right elbow: Normal-appearing Normal elbow motion. Tender palpation at lateral epicondyle. Intact elbow strength. Pain is reproduced with resisted wrist and finger extension. Distally strength is intact.    Lab and Radiology Results  Diagnostic Limited MSK Ultrasound of: Right lateral elbow Common extensor tendon origin at lateral epicondyle visualized with mild hypoechoic change at superficial portion consistent with lateral epicondylitis.  No visible tear is present.  Radial head and lateral elbow joint are normal-appearing ultrasound without obvious fracture or joint effusion. Impression: Lateral epicondylitis  X-ray images right elbow ordered today but not obtained   Assessment and Plan: 57 y.o. female with right elbow pain thought to be due to lateral epicondylitis.  Plan for home exercise program and nitroglycerin patch protocol.  Exercise program taught today prior to discharge.  We talked about nitroglycerin patch protocol.  She does  get migraine auras but does not have severe headaches.  She should be able to tolerate the nitroglycerin reasonably well.  If this is not sufficient we can proceed to occupational hand therapy.  She will let me know how she feels.  Recheck in 3 months.   PDMP not reviewed this encounter. Orders Placed This Encounter  Procedures   [place] LIMITED JOINT SPACE STRUCTURES UP RIGHT(NO LINKED CHARGES)    Order Specific Question:   Reason for Exam (SYMPTOM  OR DIAGNOSIS REQUIRED)    Answer:   right elbow pain    Order Specific Question:   Preferred imaging location?    Answer:   Ad[facility] ELBOW COMPLETE RIGHT (3+VIEW)    Standing Status:   Future    Standing Expiration Date:   12/23/2023    Order Specific Question:   Reason for Exam (SYMPTOM  OR DIAGNOSIS REQUIRED)    Answer:   eval elbow pain    Order Specific Question:   Is patient pregnant?    Answer:   No    Order Specific Question:   Preferred imaging location?    Answer:   [facility]   Meds ordered this encounter  Medications   nitroGLYCERIN (NITRODUR - DOSED IN MG/24 HR) 0.2 mg/hr patch    Sig: Apply 1/4 patch daily to tendon for tendonitis.    Dispense:  30 patch    Refill:  1     Discussed warning signs or symptoms. Please see discharge instructions. Patient expresses understanding.   The above documentation has been reviewed and is  accurate and complete Lynne Leader, M.D.

## 2023-03-09 DIAGNOSIS — Z124 Encounter for screening for malignant neoplasm of cervix: Secondary | ICD-10-CM | POA: Diagnosis not present

## 2023-03-09 DIAGNOSIS — N958 Other specified menopausal and perimenopausal disorders: Secondary | ICD-10-CM | POA: Diagnosis not present

## 2023-03-09 DIAGNOSIS — Z1231 Encounter for screening mammogram for malignant neoplasm of breast: Secondary | ICD-10-CM | POA: Diagnosis not present

## 2023-03-09 DIAGNOSIS — Z01419 Encounter for gynecological examination (general) (routine) without abnormal findings: Secondary | ICD-10-CM | POA: Diagnosis not present

## 2023-03-09 DIAGNOSIS — N951 Menopausal and female climacteric states: Secondary | ICD-10-CM | POA: Diagnosis not present

## 2023-03-09 LAB — HM MAMMOGRAPHY

## 2023-03-15 LAB — HM PAP SMEAR: HPV, high-risk: NEGATIVE

## 2023-04-05 DIAGNOSIS — D485 Neoplasm of uncertain behavior of skin: Secondary | ICD-10-CM | POA: Diagnosis not present

## 2023-04-05 DIAGNOSIS — L218 Other seborrheic dermatitis: Secondary | ICD-10-CM | POA: Diagnosis not present

## 2023-04-05 DIAGNOSIS — L820 Inflamed seborrheic keratosis: Secondary | ICD-10-CM | POA: Diagnosis not present

## 2023-04-05 DIAGNOSIS — D2262 Melanocytic nevi of left upper limb, including shoulder: Secondary | ICD-10-CM | POA: Diagnosis not present

## 2023-04-05 DIAGNOSIS — L918 Other hypertrophic disorders of the skin: Secondary | ICD-10-CM | POA: Diagnosis not present

## 2023-04-19 DIAGNOSIS — Z6824 Body mass index (BMI) 24.0-24.9, adult: Secondary | ICD-10-CM | POA: Diagnosis not present

## 2023-04-19 DIAGNOSIS — H1031 Unspecified acute conjunctivitis, right eye: Secondary | ICD-10-CM | POA: Diagnosis not present

## 2023-05-27 ENCOUNTER — Encounter: Payer: Self-pay | Admitting: Internal Medicine

## 2023-05-27 ENCOUNTER — Other Ambulatory Visit (INDEPENDENT_AMBULATORY_CARE_PROVIDER_SITE_OTHER): Payer: 59

## 2023-05-27 ENCOUNTER — Ambulatory Visit (INDEPENDENT_AMBULATORY_CARE_PROVIDER_SITE_OTHER): Payer: 59 | Admitting: Internal Medicine

## 2023-05-27 VITALS — BP 110/80 | HR 67 | Temp 98.2°F | Ht 64.5 in | Wt 157.4 lb

## 2023-05-27 DIAGNOSIS — Z0000 Encounter for general adult medical examination without abnormal findings: Secondary | ICD-10-CM

## 2023-05-27 DIAGNOSIS — Z79890 Hormone replacement therapy (postmenopausal): Secondary | ICD-10-CM

## 2023-05-27 DIAGNOSIS — G43009 Migraine without aura, not intractable, without status migrainosus: Secondary | ICD-10-CM | POA: Diagnosis not present

## 2023-05-27 DIAGNOSIS — E785 Hyperlipidemia, unspecified: Secondary | ICD-10-CM

## 2023-05-27 DIAGNOSIS — Z79899 Other long term (current) drug therapy: Secondary | ICD-10-CM

## 2023-05-27 DIAGNOSIS — Z86718 Personal history of other venous thrombosis and embolism: Secondary | ICD-10-CM | POA: Diagnosis not present

## 2023-05-27 DIAGNOSIS — Z23 Encounter for immunization: Secondary | ICD-10-CM

## 2023-05-27 LAB — COMPREHENSIVE METABOLIC PANEL
ALT: 20 U/L (ref 0–35)
AST: 18 U/L (ref 0–37)
Albumin: 4.3 g/dL (ref 3.5–5.2)
Alkaline Phosphatase: 37 U/L — ABNORMAL LOW (ref 39–117)
BUN: 15 mg/dL (ref 6–23)
CO2: 28 meq/L (ref 19–32)
Calcium: 9.1 mg/dL (ref 8.4–10.5)
Chloride: 104 meq/L (ref 96–112)
Creatinine, Ser: 0.73 mg/dL (ref 0.40–1.20)
GFR: 92.92 mL/min (ref >60.00)
Glucose, Bld: 96 mg/dL (ref 70–99)
Potassium: 4.1 meq/L (ref 3.5–5.1)
Sodium: 139 meq/L (ref 135–145)
Total Bilirubin: 0.7 mg/dL (ref 0.2–1.2)
Total Protein: 7 g/dL (ref 6.0–8.3)

## 2023-05-27 LAB — TSH: TSH: 2.49 u[IU]/mL (ref 0.35–5.50)

## 2023-05-27 LAB — CBC WITH DIFFERENTIAL/PLATELET
Basophils Absolute: 0.1 10*3/uL (ref 0.0–0.1)
Basophils Relative: 1.5 % (ref 0.0–3.0)
Eosinophils Absolute: 0.1 10*3/uL (ref 0.0–0.7)
Eosinophils Relative: 2.7 % (ref 0.0–5.0)
HCT: 44 % (ref 36.0–46.0)
Hemoglobin: 14.3 g/dL (ref 12.0–15.0)
Lymphocytes Relative: 27.3 % (ref 12.0–46.0)
Lymphs Abs: 1.1 10*3/uL (ref 0.7–4.0)
MCHC: 32.6 g/dL (ref 30.0–36.0)
MCV: 93.8 fL (ref 78.0–100.0)
Monocytes Absolute: 0.4 10*3/uL (ref 0.1–1.0)
Monocytes Relative: 9.1 % (ref 3.0–12.0)
Neutro Abs: 2.4 10*3/uL (ref 1.4–7.7)
Neutrophils Relative %: 59.4 % (ref 43.0–77.0)
Platelets: 436 10*3/uL — ABNORMAL HIGH (ref 150.0–400.0)
RBC: 4.69 Mil/uL (ref 3.87–5.11)
RDW: 13.6 % (ref 11.5–15.5)
WBC: 4 10*3/uL (ref 4.0–10.5)

## 2023-05-27 LAB — HEMOGLOBIN A1C: Hgb A1c MFr Bld: 5.2 % (ref 4.6–6.5)

## 2023-05-27 LAB — LIPID PANEL
Cholesterol: 186 mg/dL (ref 0–200)
HDL: 61.6 mg/dL (ref >39.00)
LDL Cholesterol: 110 mg/dL — ABNORMAL HIGH (ref 0–99)
NonHDL: 124.11
Total CHOL/HDL Ratio: 3
Triglycerides: 72 mg/dL (ref 0.0–149.0)
VLDL: 14.4 mg/dL (ref 0.0–40.0)

## 2023-05-27 LAB — TESTOSTERONE: Testosterone: 29.38 ng/dL (ref 15.00–40.00)

## 2023-05-27 MED ORDER — SUMATRIPTAN SUCCINATE 25 MG PO TABS: ORAL_TABLET | ORAL | 0 refills | Status: AC

## 2023-05-27 NOTE — Patient Instructions (Addendum)
Good to see you today .  Some risk of dvt elevation although maybe less since immobilized . However consider lowest does and asa or other if travel precautions.  Will refill imitrex x 1 in case needed .  Lab at [facility]   Plan fu depending .  If travel  compression stocking and may be asa 81 mg   Avoid immobilization.   Let me know when need refill of the alprazolam. As discussed

## 2023-05-27 NOTE — Progress Notes (Signed)
Chief Complaint  Patient presents with   Annual Exam    HPI: Patient  Kendra Fox  57 y.o. comes in today for Preventive Health Care visit  and med check evaluation   On hrt  estradiol  0.75 patch 2 x per week  and progesterone 200 per day for the past 6 weeks lower dose not has helpful but no she thinks helps fogginess and definitely sleep .   Sweats gone  her gyne would't rx because of remote hx of dvt le after ankle fracture immobilization and when she was smoking  however she had reading  about meds and doesn't want to go the route of substitute  gabapentin etc  .   She  received these hormones  on line  .  Neg fam hx of  clotting . Stopped tobacco in the 2000's  Alprazolam  has a bottle left . Wants to mae sure if needs a refill can have on hand b ut not needed for a while  Imitrex rare use but will need refill if needed. Last rx was 2019  at [facility]  Health Maintenance  Topic Date Due   Cervical Cancer Screening (HPV/Pap Cotest)  08/25/2023 (Originally 11/09/2015)   INFLUENZA VACCINE  11/25/2023 (Originally 03/11/2023)   Colonoscopy  11/25/2023 (Originally 06/29/2013)   COVID-19 Vaccine (5 - 2023-24 season) 11/25/2023 (Originally 04/11/2023)   Hepatitis C Screening  05/26/2024 (Originally 06/29/1986)   HIV Screening  05/26/2024 (Originally 06/30/1983)   Zoster Vaccines- Shingrix (2 of 2) 07/22/2023   DTaP/Tdap/Td (3 - Td or Tdap) 08/10/2024   MAMMOGRAM  03/08/2025   HPV VACCINES  Aged Out   Health Maintenance Review LIFESTYLE:  Exercise:   weight traning and steps  Tobacco/ETS: n Alcohol:  social Sugar beverages: not a lot  Sleep: 8 hours  Drug use: no HH of  2  2 pets  Work:  non paid  Biosil  omega 3  and mv  vegetarian  probiotic    ROS:  GEN/ HEENT: No fever, significant weight changes sweats headaches vision problems hearing changes, CV/ PULM; No chest pain shortness of breath cough, syncope,edema  change in exercise tolerance. GI /GU: No adominal pain,  vomiting, change in bowel habits. No blood in the stool. No significant GU symptoms. SKIN/HEME: ,no acute skin rashes suspicious lesions or bleeding. No lymphadenopathy, nodules, masses.  NEURO/ PSYCH:  No neurologic signs such as weakness numbness. No depression anxiety. IMM/ Allergy: No unusual infections.  Allergy .   REST of 12 system review negative except as per HPI   Past Medical History:  Diagnosis Date   Allergic rhinitis    Bilateral carpal tunnel syndrome 02/26/2016   Environmental and seasonal allergies    Ragweed, grasses, dust mite, mold   Fracture    left ankle   History of DVT (deep vein thrombosis)    2006   sp ankle fracture  immobilization    Increased severity of headaches     Past Surgical History:  Procedure Laterality Date   L ankle frac Left 2015   TOOTH EXTRACTION     WRIST FRACTURE SURGERY Right 2015   Dr. Amanda Pea    Family History  Problem Relation Age of Onset   Migraines Mother    COPD Father        smoker   Parkinsonism Father     Social History   Socioeconomic History   Marital status: Married    Spouse name: Mardelle Matte   Number of children: 0  Years of education: College   Highest education level: Not on file  Occupational History    Employer: [employer]  Tobacco Use   Smoking status: Former   Smokeless tobacco: Never   Tobacco comments:    Quit in 2004  Vaping Use   Vaping status: Never Used  Substance and Sexual Activity   Alcohol use: Yes    Alcohol/week: 6.0 standard drinks of alcohol    Types: 6 Glasses of wine per week    Comment: social   Drug use: No   Sexual activity: Not on file  Other Topics Concern   Not on file  Social History Narrative   Patient is married Mardelle Matte)   Patient works at [employer]   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   Married  HHof 2       Regular exercise-no   Vegan soy milk     neg tobacco 1-2 tea    8 hours sleep     Patient is right-handed.   Patient drinks 2-3 cups of coffee daily.   Patient has a college education.   Social Determinants of Health   Financial Resource Strain: Patient Declined (05/27/2023)   Overall Financial Resource Strain (CARDIA)    Difficulty of Paying Living Expenses: Patient declined  Food Insecurity: Patient Declined (05/27/2023)   Hunger Vital Sign    Worried About Running Out of Food in the Last Year: Patient declined    Ran Out of Food in the Last Year: Patient declined  Transportation Needs: Patient Declined (05/27/2023)   PRAPARE - Administrator, Civil Service (Medical): Patient declined    Lack of Transportation (Non-Medical): Patient declined  Physical Activity: Sufficiently Active (05/27/2023)   Exercise Vital Sign    Days of Exercise per Week: 5 days    Minutes of Exercise per Session: 40 min  Stress: No Stress Concern Present (05/27/2023)   [employer] of Occupational Health - Occupational Stress Questionnaire    Feeling of Stress : Not at all  Social Connections: Unknown (05/27/2023)   Social Connection and Isolation Panel [NHANES]    Frequency of Communication with Friends and Family: Patient declined    Frequency of Social Gatherings with Friends and Family: Patient declined    Attends Religious Services: Patient declined    Database administrator or Organizations: Patient declined    Attends Engineer, structural: Not on file    Marital Status: Patient declined    Outpatient Medications Prior to Visit  Medication Sig Dispense Refill   ALPRAZolam (XANAX) 0.25 MG tablet Take 1 tablet (0.25 mg total) by mouth at bedtime as needed. 30 tablet 1   b complex vitamins tablet Take 1 tablet by mouth daily.     fexofenadine (ALLEGRA) 30 MG tablet Take 180 mg by mouth daily.      fluticasone (FLONASE) 50 MCG/ACT nasal spray Place into both nostrils daily.     ibuprofen (ADVIL) 800 MG tablet TAKE 1 TABLET BY MOUTH EVERY 8 HOURS AS  NEEDED 30 tablet 1   MULTIPLE VITAMIN PO Take by mouth daily.     SUMAtriptan (IMITREX) 25 MG tablet Take 1 tablet at the onset of migraine. If headache persist can take an additional tablet. Not to exceed 2 tablets in 24 hours. 10 tablet 11   nitroGLYCERIN (NITRODUR - DOSED IN MG/24 HR) 0.2 mg/hr patch Apply 1/4 patch daily to tendon for  tendonitis. 30 patch 1   No facility-administered medications prior to visit.     EXAM:  BP 110/80 (BP Location: Left Arm, Patient Position: Sitting, Cuff Size: Normal)   Pulse 67   Temp 98.2 F (36.8 C) (Oral)   Ht 5' 4.5" (1.638 m)   Wt 157 lb 6.4 oz (71.4 kg)   LMP 04/22/2016   SpO2 98%   BMI 26.60 kg/m   Body mass index is 26.6 kg/m. Wt Readings from Last 3 Encounters:  05/27/23 157 lb 6.4 oz (71.4 kg)  12/23/22 155 lb (70.3 kg)  01/06/22 161 lb 3.2 oz (73.1 kg)    Physical Exam: Vital signs reviewed EAV:WUJW is a well-developed well-nourished alert cooperative    who appearsr stated age in no acute distress.  HEENT: normocephalic atraumatic , Eyes: PERRL EOM's full, conjunctiva clear, Nares: paten,t no deformity discharge or tenderness., Ears: no deformity EAC's clear TMs with normal landmarks. Mouth: clear OP, no lesions, edema.  Moist mucous membranes. Dentition in adequate repair. NECK: supple without masses, thyromegaly or bruits. CHEST/PULM:  Clear to auscultation and percussion breath sounds equal no wheeze , rales or rhonchi. No chest wall deformities or tenderness. Breast: normal by inspection . No dimpling, discharge, masses, tenderness or discharge . CV: PMI is nondisplaced, S1 S2 no gallops, murmurs, rubs. Peripheral pulses are full without delay.No JVD .  ABDOMEN: Bowel sounds normal nontender  No guard or rebound, no hepato splenomegal no CVA tenderness.  No hernia. Extremtities:  No clubbing cyanosis or edema, no acute joint swelling or redness no focal atrophy NEURO:  Oriented x3, cranial nerves 3-12 appear to be intact,  no obvious focal weakness,gait within normal limits no abnormal reflexes or asymmetrical SKIN: No acute rashes normal turgor, color, no bruising or petechiae. PSYCH: Oriented, good eye contact, no obvious depression anxiety, cognition and judgment appear normal. LN: no cervical axillary adenopathy  Lab Results  Component Value Date   WBC 4.0 05/27/2023   HGB 14.3 05/27/2023   HCT 44.0 05/27/2023   PLT 436.0 (H) 05/27/2023   GLUCOSE 96 05/27/2023   CHOL 186 05/27/2023   TRIG 72.0 05/27/2023   HDL 61.60 05/27/2023   LDLDIRECT 121.0 04/25/2013   LDLCALC 110 (H) 05/27/2023   ALT 20 05/27/2023   AST 18 05/27/2023   NA 139 05/27/2023   K 4.1 05/27/2023   CL 104 05/27/2023   CREATININE 0.73 05/27/2023   BUN 15 05/27/2023   CO2 28 05/27/2023   TSH 2.49 05/27/2023   HGBA1C 5.2 05/27/2023    BP Readings from Last 3 Encounters:  05/27/23 110/80  12/23/22 104/82  01/15/22 111/80    Lab plan fasting reviewed with patient   ASSESSMENT AND PLAN:  Discussed the following assessment and plan:    ICD-10-CM   1. Visit for preventive health examination  Z00.00 Zoster Recombinant (Shingrix )    CBC with Differential/Platelet    Comprehensive metabolic panel    Lipid panel    TSH    Hemoglobin A1c    Testosterone    2. Need for shingles vaccine  Z23 Zoster Recombinant (Shingrix )    3. Migraine without aura and without status migrainosus, not intractable  G43.009 CBC with Differential/Platelet    Comprehensive metabolic panel    Lipid panel    TSH    Hemoglobin A1c    Testosterone    4. Post-menopause on HRT (hormone replacement therapy)  Z79.890 CBC with Differential/Platelet    Comprehensive metabolic panel  Lipid panel    TSH    Hemoglobin A1c    Testosterone    Estradiol    5. Medication management  Z79.899 CBC with Differential/Platelet    Comprehensive metabolic panel    Lipid panel    TSH    Hemoglobin A1c    Testosterone    6. Hyperlipidemia, unspecified  hyperlipidemia type  E78.5 CBC with Differential/Platelet    Comprehensive metabolic panel    Lipid panel    TSH    Hemoglobin A1c    Testosterone    7. History of DVT (deep vein thrombosis)  Z86.718     Apparently feels much better on hrt . Rx from [facility].  Now on 6 weeks of 0.075 gyne will not rx cause of hx of dvt although was a triggered event and no recurrence complication.  Ordered hormone level at her request .  Uncertain risk benefit  of hrt but  pat has investigated  risk and feels much better / May want to take other precautions if travel and high risk . Uncertain risk of reclot . Lowest does hrt as possible advised  Rare use of alprazolam  let us know when needs refill.  Imitrex x 1 if needed   Return for depending on results 6-12 month .  Patient Care Team: Louine Tenpenny, Neta Mends, MD as PCP - Regenia Skeeter, MD as Consulting Physician (Dermatology) Dominica Severin, MD as Consulting Physician (Orthopedic Surgery) Dohmeier, Porfirio Mylar, MD as Consulting Physician (Neurology) Marlow Baars, MD as Consulting Physician (Obstetrics) Patient Instructions  Good to see you today .  Some risk of dvt elevation although maybe less since immobilized . However consider lowest does and asa or other if travel precautions.  Will refill imitrex x 1 in case needed .  Lab at [facility]   Plan fu depending .  If travel  compression stocking and may be asa 81 mg   Avoid immobilization.   Let me know when need refill of the alprazolam. As discussed    Neta Mends. Rhyatt Muska M.D.

## 2023-05-28 LAB — ESTRADIOL: Estradiol: 108 pg/mL

## 2023-05-28 NOTE — Progress Notes (Signed)
Hormone levels in  range . No diabetes . Thyroid normal   Cholesterol is about the same .  Low risk 10 year risk  Continue lifestyle intervention healthy eating and exercise .    The 10-year ASCVD risk score (Arnett DK, et al., 2019) is: 1.1%   Values used to calculate the score:     Age: 57 years     Sex: Female     Is Non-Hispanic African American: No     Diabetic: No     Tobacco smoker: No     Systolic Blood Pressure: 110 mmHg     Is BP treated: No     HDL Cholesterol: 61.6 mg/dL     Total Cholesterol: 186 mg/dL

## 2023-06-16 ENCOUNTER — Encounter: Payer: Self-pay | Admitting: Internal Medicine

## 2023-07-29 DIAGNOSIS — D485 Neoplasm of uncertain behavior of skin: Secondary | ICD-10-CM | POA: Diagnosis not present

## 2023-07-29 DIAGNOSIS — L820 Inflamed seborrheic keratosis: Secondary | ICD-10-CM | POA: Diagnosis not present

## 2023-09-20 LAB — HM MAMMOGRAPHY
